# Patient Record
Sex: Female | Born: 1965 | Race: White | Hispanic: No | Marital: Married | Smoking: Never smoker
Health system: Southern US, Community
[De-identification: ages and names within clinical notes are randomized; demographics above are authoritative.]

## PROBLEM LIST (undated history)

## (undated) DIAGNOSIS — J45909 Unspecified asthma, uncomplicated: Secondary | ICD-10-CM

## (undated) DIAGNOSIS — E119 Type 2 diabetes mellitus without complications: Secondary | ICD-10-CM

## (undated) HISTORY — PX: ABDOMINAL HYSTERECTOMY: SHX81

---

## 2005-08-23 ENCOUNTER — Emergency Department: Payer: Self-pay | Admitting: Emergency Medicine

## 2005-08-24 ENCOUNTER — Emergency Department: Payer: Self-pay | Admitting: Emergency Medicine

## 2009-06-11 ENCOUNTER — Emergency Department: Payer: Self-pay | Admitting: Emergency Medicine

## 2012-09-27 ENCOUNTER — Emergency Department: Payer: Self-pay | Admitting: Emergency Medicine

## 2012-12-28 ENCOUNTER — Ambulatory Visit: Payer: Self-pay | Admitting: Family Medicine

## 2013-07-14 ENCOUNTER — Emergency Department (HOSPITAL_COMMUNITY)
Admission: EM | Admit: 2013-07-14 | Discharge: 2013-07-14 | Disposition: A | Discharge status: Home / Self Care | Payer: BC Managed Care – PPO | Attending: Emergency Medicine | Admitting: Emergency Medicine

## 2013-07-14 ENCOUNTER — Encounter (HOSPITAL_COMMUNITY): Payer: Self-pay | Admitting: *Deleted

## 2013-07-14 DIAGNOSIS — T628X1A Toxic effect of other specified noxious substances eaten as food, accidental (unintentional), initial encounter: Secondary | ICD-10-CM | POA: Insufficient documentation

## 2013-07-14 DIAGNOSIS — T7840XA Allergy, unspecified, initial encounter: Secondary | ICD-10-CM

## 2013-07-14 DIAGNOSIS — R110 Nausea: Secondary | ICD-10-CM | POA: Insufficient documentation

## 2013-07-14 DIAGNOSIS — Z791 Long term (current) use of non-steroidal anti-inflammatories (NSAID): Secondary | ICD-10-CM | POA: Insufficient documentation

## 2013-07-14 DIAGNOSIS — J45901 Unspecified asthma with (acute) exacerbation: Secondary | ICD-10-CM | POA: Insufficient documentation

## 2013-07-14 DIAGNOSIS — Y9389 Activity, other specified: Secondary | ICD-10-CM | POA: Insufficient documentation

## 2013-07-14 DIAGNOSIS — R42 Dizziness and giddiness: Secondary | ICD-10-CM | POA: Insufficient documentation

## 2013-07-14 DIAGNOSIS — Z880 Allergy status to penicillin: Secondary | ICD-10-CM | POA: Insufficient documentation

## 2013-07-14 DIAGNOSIS — R0682 Tachypnea, not elsewhere classified: Secondary | ICD-10-CM | POA: Insufficient documentation

## 2013-07-14 DIAGNOSIS — Y9289 Other specified places as the place of occurrence of the external cause: Secondary | ICD-10-CM | POA: Insufficient documentation

## 2013-07-14 DIAGNOSIS — R1310 Dysphagia, unspecified: Secondary | ICD-10-CM | POA: Insufficient documentation

## 2013-07-14 DIAGNOSIS — Z79899 Other long term (current) drug therapy: Secondary | ICD-10-CM | POA: Insufficient documentation

## 2013-07-14 HISTORY — DX: Unspecified asthma, uncomplicated: J45.909

## 2013-07-14 MED ORDER — EPINEPHRINE 0.3 MG/0.3ML IJ SOAJ: 0.3000 mg | INTRAMUSCULAR | Status: AC | PRN

## 2013-07-14 MED ORDER — RANITIDINE HCL 150 MG/10ML PO SYRP
150.0000 mg | ORAL_SOLUTION | Freq: Once | ORAL | Status: AC
  Administered 2013-07-14: 150 mg via ORAL
  Filled 2013-07-14: qty 10

## 2013-07-14 MED ORDER — PREDNISONE 10 MG PO TABS: 40.0000 mg | ORAL_TABLET | Freq: Every day | ORAL | Status: AC

## 2013-07-14 MED ORDER — DIPHENHYDRAMINE HCL 25 MG PO CAPS
25.0000 mg | ORAL_CAPSULE | Freq: Once | ORAL | Status: AC
  Administered 2013-07-14: 25 mg via ORAL
  Filled 2013-07-14: qty 1

## 2013-07-14 MED ORDER — PREDNISONE 20 MG PO TABS
60.0000 mg | ORAL_TABLET | Freq: Once | ORAL | Status: AC
  Administered 2013-07-14: 60 mg via ORAL
  Filled 2013-07-14: qty 3

## 2013-07-14 MED ORDER — EPINEPHRINE 0.3 MG/0.3ML IJ SOAJ
0.3000 mg | Freq: Once | INTRAMUSCULAR | Status: AC
  Administered 2013-07-14: 0.3 mg via INTRAMUSCULAR
  Filled 2013-07-14: qty 0.3

## 2013-07-14 NOTE — ED Provider Notes (Signed)
Patient developed shortness of breath after she ate eggs approximately 20 2:45 PM today. Patient is mildly anxious appearing, speaks in paragraphs, lungs clear auscultation heart regular rate and rhythm abdomen nondistended nontender skin warm dry no rash.  Doug Sou, MD 07/15/13 1478

## 2013-07-14 NOTE — ED Notes (Signed)
The pts sats are good no visible swelling to the back of her throat.  She is very apprehensive and nervous.  Reactions x 4 in the past for egg exposure

## 2013-07-14 NOTE — ED Provider Notes (Signed)
CSN: [id number]     Arrival date & time 07/14/13  1517 History     None    Chief Complaint  Patient presents with  . Allergic Reaction   (Consider location/radiation/quality/duration/timing/severity/associated sxs/prior Treatment) HPI 47 year old female with history of asthma and antiallergy presents with shortness of breath, lightheadedness, nausea after eating eggs at approximately 2:45 PM. She reports she ate food that she did not have contained eggs, and shortly after this began to feel short of breath, lightheaded, nauseous. Reports she feels as if her throat is closing. Denies any vomiting, diarrhea, or oral swelling, rash, pruritus. Reports she has had 4 other episodes in the past when she's been exposed eggs. Has never received epinephrine, and has never owned an EpiPen. Symptoms are moderate and patient feels that they are improving slightly.   Past Medical History  Diagnosis Date  . Asthma    History reviewed. No pertinent past surgical history. No family history on file. History  Substance Use Topics  . Smoking status: Never Smoker   . Smokeless tobacco: Not on file  . Alcohol Use: No   OB History   Grav Para Term Preterm Abortions TAB SAB Ect Mult Living                 Review of Systems  Constitutional: Negative for fever.  HENT: Positive for trouble swallowing. Negative for sore throat and neck pain.   Eyes: Negative for visual disturbance.  Respiratory: Positive for shortness of breath. Negative for cough.   Cardiovascular: Negative for chest pain.  Gastrointestinal: Negative for nausea, vomiting and abdominal pain.  Genitourinary: Negative for difficulty urinating.  Musculoskeletal: Negative for back pain.  Skin: Negative for rash.  Allergic/Immunologic: Positive for food allergies.  Neurological: Positive for light-headedness. Negative for syncope and headaches.    Allergies  Baby powder; Eggs or egg-derived products; Penicillins; and Sulfa  antibiotics  Home Medications   Current Outpatient Rx  Name  Route  Sig  Dispense  Refill  . diphenhydrAMINE (BENADRYL) 25 MG tablet   Oral   Take 25 mg by mouth every 6 (six) hours as needed (for allergic reaction).          . fish oil-omega-3 fatty acids 1000 MG capsule   Oral   Take 1 g by mouth 2 (two) times daily.         Marland Kitchen lubiprostone (AMITIZA) 8 MCG capsule   Oral   Take 8 mcg by mouth 2 (two) times daily with a meal.         . meloxicam (MOBIC) 7.5 MG tablet   Oral   Take 7.5 mg by mouth 2 (two) times daily.         . traMADol (ULTRAM) 50 MG tablet   Oral   Take 50 mg by mouth every 6 (six) hours as needed for pain.         Marland Kitchen VITAMIN E PO   Oral   Take 1 tablet by mouth daily.         Marland Kitchen EPINEPHrine (EPIPEN) 0.3 mg/0.3 mL SOAJ injection   Intramuscular   Inject 0.3 mLs (0.3 mg total) into the muscle as needed.   2 Device   0   . predniSONE (DELTASONE) 10 MG tablet   Oral   Take 4 tablets (40 mg total) by mouth daily.   8 tablet   0    BP 120/54  Pulse 82  Temp(Src) 97.7 F (36.5 C)  Resp 16  SpO2  99%  LMP 06/16/2013 Physical Exam  Nursing note and vitals reviewed. Constitutional: She is oriented to person, place, and time. She appears well-developed and well-nourished. No distress.  HENT:  Head: Normocephalic and atraumatic.  Mouth/Throat: No oropharyngeal exudate.  Eyes: Conjunctivae and EOM are normal.  Neck: Normal range of motion.  Cardiovascular: Normal rate, regular rhythm, normal heart sounds and intact distal pulses.  Exam reveals no gallop and no friction rub.   No murmur heard. Pulmonary/Chest: Breath sounds normal. Tachypnea (mild, improved during evaluation) noted. No respiratory distress. She has no wheezes. She has no rales.  Abdominal: Soft. She exhibits no distension. There is no tenderness. There is no guarding.  Musculoskeletal: She exhibits no edema and no tenderness.  Neurological: She is alert and oriented to  person, place, and time.  Skin: Skin is warm and dry. No rash noted. She is not diaphoretic. No erythema.    ED Course   Procedures (including critical care time)  Labs Reviewed - No data to display No results found. 1. Allergic reaction, initial encounter     MDM  48-year-old female with history of asthma and antiallergy presents with shortness of breath, lightheadedness, nausea after eating eggs at approximately 2:45 PM. Patient rested emergency department with stable vital signs, however with symptoms suggestive of possible anaphylaxis. Patient reported shortness of breath, lightheadedness and nausea as well sensation that her throat is closing and was tachypneic on original evaluation. Patient given IM epinephrine, with improvement of symptoms and no other complication. Given 60 mg of prednisone, 25 mg of Benadryl to total 15 mg, Zantac, and was observed for 6 hours in the emergency department. Patient exhibited no rebound of symptoms, including no rash, nausea, throat swelling, tongue swelling, greatest, lightheadedness, SOB.  Patient was discharged with a prescription for 2 epi pens, and given instructions of when to use this. Given prednisone for the next 2 days and told to schedule Benadryl. Discharged in stable condition understanding resistant to return. Care discussed with attending Dr. Ethelda Chick.  Rhae Lerner, MD 07/15/13 1610  Rhae Lerner, MD 07/15/13 [id number]

## 2013-07-14 NOTE — ED Notes (Signed)
The pt reports that she is allergic to eggs and  She ate  Food that contained eggs approx 1430. She feels like her throat is closing up with dizziness.  She took a 25mg  benadryl just after she realized that the meal had egg in it. No distress

## 2013-07-14 NOTE — ED Notes (Signed)
Pharmacy tech at bedside 

## 2013-07-14 NOTE — ED Notes (Addendum)
Pt reports she is allergic to eggs, ate lunch around 245pm today, didn't know there were eggs in her meal, once she found out she immediately took a benadryl. sts she feels dizzy, nauseous and sob. sts these are the same symptoms she has gotten in the past. Denies use or having an epipen. Denies itchiness/rash. Pt speaking in short sentences. Pt in nad, skin warm and dry, resp e/u.

## 2013-07-14 NOTE — ED Notes (Signed)
Pt has ride home.

## 2013-07-15 NOTE — ED Provider Notes (Signed)
I have personally seen and examined the patient.  I have discussed the plan of care with the resident.  I have reviewed the documentation on PMH/FH/Soc. History.  I have reviewed the documentation of the resident and agree.  Doug Sou, MD 07/15/13 1452

## 2013-11-06 ENCOUNTER — Emergency Department: Payer: Self-pay | Admitting: Emergency Medicine

## 2014-01-03 ENCOUNTER — Emergency Department: Payer: Self-pay | Admitting: Internal Medicine

## 2014-12-26 ENCOUNTER — Ambulatory Visit: Payer: Self-pay | Admitting: Unknown Physician Specialty

## 2015-03-19 ENCOUNTER — Ambulatory Visit
Admit: 2015-03-19 | Disposition: A | Discharge status: Home / Self Care | Payer: Self-pay | Attending: Obstetrics and Gynecology | Admitting: Obstetrics and Gynecology

## 2015-03-19 LAB — COMPREHENSIVE METABOLIC PANEL
ALK PHOS: 62 U/L
ALT: 30 U/L
AST: 23 U/L
Albumin: 4 g/dL
Anion Gap: 4 — ABNORMAL LOW (ref 7–16)
BUN: 9 mg/dL
Bilirubin,Total: 0.9 mg/dL
CHLORIDE: 109 mmol/L
CO2: 27 mmol/L
Calcium, Total: 9 mg/dL
Creatinine: 0.67 mg/dL
EGFR (Non-African Amer.): >60
Glucose: 102 mg/dL — ABNORMAL HIGH
POTASSIUM: 4.1 mmol/L
Sodium: 140 mmol/L
TOTAL PROTEIN: 6.8 g/dL

## 2015-03-19 LAB — CBC WITH DIFFERENTIAL/PLATELET
BASOS PCT: 0.5 %
Basophil #: 0 10*3/uL (ref 0.0–0.1)
EOS PCT: 1.5 %
Eosinophil #: 0.1 10*3/uL (ref 0.0–0.7)
HCT: 38 % (ref 35.0–47.0)
HGB: 12.4 g/dL (ref 12.0–16.0)
LYMPHS ABS: 2.1 10*3/uL (ref 1.0–3.6)
Lymphocyte %: 23.2 %
MCH: 28.7 pg (ref 26.0–34.0)
MCHC: 32.7 g/dL (ref 32.0–36.0)
MCV: 88 fL (ref 80–100)
MONOS PCT: 8.6 %
Monocyte #: 0.8 x10 3/mm (ref 0.2–0.9)
NEUTROS PCT: 66.2 %
Neutrophil #: 6 10*3/uL (ref 1.4–6.5)
Platelet: 258 10*3/uL (ref 150–440)
RBC: 4.32 10*6/uL (ref 3.80–5.20)
RDW: 13.2 % (ref 11.5–14.5)
WBC: 9 10*3/uL (ref 3.6–11.0)

## 2015-03-25 ENCOUNTER — Inpatient Hospital Stay
Admit: 2015-03-25 | Disposition: A | Discharge status: Home / Self Care | Payer: Self-pay | Attending: Obstetrics and Gynecology | Admitting: Obstetrics and Gynecology

## 2015-03-25 LAB — CBC WITH DIFFERENTIAL/PLATELET
BASOS ABS: 0 10*3/uL (ref 0.0–0.1)
Basophil %: 0.1 %
Eosinophil #: 0 10*3/uL (ref 0.0–0.7)
Eosinophil %: 0.1 %
HCT: 37.2 % (ref 35.0–47.0)
HGB: 12.4 g/dL (ref 12.0–16.0)
LYMPHS ABS: 1.2 10*3/uL (ref 1.0–3.6)
LYMPHS PCT: 5.3 %
MCH: 28.9 pg (ref 26.0–34.0)
MCHC: 33.3 g/dL (ref 32.0–36.0)
MCV: 87 fL (ref 80–100)
MONO ABS: 0.8 x10 3/mm (ref 0.2–0.9)
MONOS PCT: 3.3 %
NEUTROS PCT: 91.2 %
Neutrophil #: 21 10*3/uL — ABNORMAL HIGH (ref 1.4–6.5)
PLATELETS: 246 10*3/uL (ref 150–440)
RBC: 4.3 10*6/uL (ref 3.80–5.20)
RDW: 13.7 % (ref 11.5–14.5)
WBC: 23 10*3/uL — AB (ref 3.6–11.0)

## 2015-03-26 LAB — BASIC METABOLIC PANEL
ANION GAP: 2 — AB (ref 7–16)
BUN: 10 mg/dL
CALCIUM: 7.7 mg/dL — AB
CO2: 25 mmol/L
Chloride: 109 mmol/L
Creatinine: 0.66 mg/dL
Glucose: 115 mg/dL — ABNORMAL HIGH
Potassium: 4.3 mmol/L
Sodium: 136 mmol/L

## 2015-03-26 LAB — HEMATOCRIT: HCT: 34.3 % — AB (ref 35.0–47.0)

## 2015-04-01 LAB — SURGICAL PATHOLOGY

## 2015-04-07 NOTE — Op Note (Signed)
PATIENT NAME:  Kathryn Pearson, Kathryn Pearson MR#:  [id number] DATE OF BIRTH:  [birth year]  DATE OF PROCEDURE:  03/25/2015  PREOPERATIVE DIAGNOSES:  1. Chronic pelvic pain.  2. Menorrhagia.  3. History of endometriosis.   POSTOPERATIVE DIAGNOSES: 1. Chronic pelvic pain.  2. Pelvic adhesions.   PROCEDURES: 1. Total abdominal hysterectomy.  2. Bilateral salpingo-oophorectomy.  3. Cystoscopy.   SURGEON: Suzy Bouchard, MD   FIRST ASSISTANT: Christeen Douglas, MD  ANESTHESIA: General endotracheal.   INDICATIONS: This is a 49 year old gravida 3, para 2 with a greater than 1 year history of right pelvic pain. The patient had a previous laparoscopic surgery in 1998 that demonstrated endometriosis. The patient also with heavy bleeding.   FINDINGS: The patient with a very narrow pelvis, a very deep cervix, was noted to have several dense adhesions of  the omentum to the left fundal area of the uterus and omental adhesions also noted on the left adnexa to the sidewall.   PROCEDURE IN DETAIL: After adequate general endotracheal anesthesia, the patient was placed in the dorsal supine position, was previously prophylaxed with 160 mg of gentamicin and 900 mg clindamycin given her penicillin allergy. The patient was prepped and draped in normal sterile fashion. Foley catheter was placed into the bladder after vaginal prep was performed. A Pfannenstiel incision was made 2 fingerbreadths above the symphysis pubis. Sharp dissection was used to identify the fascia. The fascia was opened in the midline and opened in a transverse fashion. The superior aspect of the fascia was grasped with Kocher clamps and the recti muscles dissected free. The inferior aspect of the fascia was grasped with Kocher clamps and the pyramidalis muscle was dissected free. Entry into the peritoneal cavity was accomplished sharply. The peritoneum was incised vertically. Initial impression inside the abdominal cavity, had several dense adhesions  to the uterus, left fundal and to the left sidewall.  These adhesions were clamped, transected,  suture ligated, and taken down additionally with the Bovie to free the adnexa on the left.  Two large Kelly clamps were placed on the cornua after the O'Connor-O'Sullivan retractor was placed into the incision and the bowel was packed cephalad with laparotomy sponges. Round ligaments on both sides were bilaterally clamped, transected, and suture ligated with 0 Vicryl suture. The anterior leaf of the broad ligament was incised along the bladder reflection to the midline from both sides.  The bladder was densely adherent to the anterior cervix. This was taken down sharply with Metzenbaum scissors. Ultimately, the bladder was dissected off the lower uterine segment and cervix to the full cervical extent. A window was made on the broad ligament bilaterally and the infundibular ligaments on both sides were doubly clamped, transected, and suture ligated with 0 Vicryl suture. Good hemostasis was noted. Uterine arteries were skeletonized bilaterally and Heaney clamped, transected, and suture ligated with 0 Vicryl suture. There was some brisk bleeding noted on both sides as the uterine arteries were transected. Several clamps were placed with ultimate control of bleeding. Again, the cervix was extremely deep and long.  Several straight Heaney clamps were used to clamp the cardinal ligaments and transecting were suture ligated with 0 Vicryl. Ultimately, the angles were clamped and the uterus and cervix was removed with the attached fallopian tubes and ovaries. The vaginal angles were closed with interrupted 0 Vicryl sutures and the vaginal cuff was then reapproximated with interrupted  0 Vicryl suture. Good hemostasis was noted at this point.  The pelvis was copiously irrigated with sterile water.  There was no active bleeding noted.  The laparotomy sponges and instruments were all removed from the abdomen. Given the amount of  dissection and the nature of the controlling of the bleeding bilaterally, surgeon felt it was prudent to do a cystoscopy which was performed at the end of the case. Foley catheter was removed and a 30 degree cystoscope was placed.  Ureter orifices were ultimately identified, very flat and flush  to the trigone of the bladder.  One mL of fluorescein was administered intravenously and normal peristalsis with the dye effluxing from each ureteral orifice was verified. The bladder was drained, Foley was replaced. Gloves were changed and attention was then directed to the patient's fascia which was closed with a 0 Vicryl suture in a running nonlocking fashion. Subcutaneous tissues were irrigated and bovied for hemostasis and given the depth of the subcutaneous tissues, the dead space was closed with a running 2-0 chromic suture. The skin was reapproximated with staples then. Given the amount of blood loss estimated at 1400 mL, the patient did receive  1 unit of packed red blood cells and she remained clinically stable throughout the procedure.   ESTIMATED BLOOD LOSS:  1400 mL.    INTRAOPERATIVE FLUIDS:  2000 mL.    URINE OUTPUT:  450 mL.    The patient was taken to the recovery room in good condition. There were no complications other than excessive blood loss.    ____________________________ Suzy Bouchardhomas J. Schermerhorn, MD tjs:tr D: 03/25/2015 17:15:34 ET T: 03/25/2015 21:01:22 ET JOB#: [id number]  cc: Suzy Bouchardhomas J. Schermerhorn, MD, <Dictator> Suzy BouchardHOMAS J SCHERMERHORN MD ELECTRONICALLY SIGNED 03/29/2015 21:12

## 2015-05-31 ENCOUNTER — Other Ambulatory Visit: Payer: Self-pay

## 2015-05-31 ENCOUNTER — Emergency Department
Admission: EM | Admit: 2015-05-31 | Discharge: 2015-06-01 | Disposition: A | Discharge status: Home / Self Care | Attending: Emergency Medicine | Admitting: Emergency Medicine

## 2015-05-31 DIAGNOSIS — R109 Unspecified abdominal pain: Secondary | ICD-10-CM

## 2015-05-31 DIAGNOSIS — Z880 Allergy status to penicillin: Secondary | ICD-10-CM | POA: Insufficient documentation

## 2015-05-31 DIAGNOSIS — Z79899 Other long term (current) drug therapy: Secondary | ICD-10-CM | POA: Diagnosis not present

## 2015-05-31 DIAGNOSIS — I951 Orthostatic hypotension: Secondary | ICD-10-CM | POA: Diagnosis not present

## 2015-05-31 DIAGNOSIS — R1110 Vomiting, unspecified: Secondary | ICD-10-CM

## 2015-05-31 DIAGNOSIS — K529 Noninfective gastroenteritis and colitis, unspecified: Secondary | ICD-10-CM

## 2015-05-31 LAB — CBC WITH DIFFERENTIAL/PLATELET
BASOS ABS: 0.1 10*3/uL (ref 0–0.1)
Basophils Relative: 1 %
Eosinophils Absolute: 0.2 10*3/uL (ref 0–0.7)
Eosinophils Relative: 1 %
HCT: 37.5 % (ref 35.0–47.0)
HEMOGLOBIN: 12.4 g/dL (ref 12.0–16.0)
LYMPHS ABS: 1.3 10*3/uL (ref 1.0–3.6)
Lymphocytes Relative: 9 %
MCH: 27.4 pg (ref 26.0–34.0)
MCHC: 33 g/dL (ref 32.0–36.0)
MCV: 82.9 fL (ref 80.0–100.0)
MONOS PCT: 7 %
Monocytes Absolute: 1 10*3/uL — ABNORMAL HIGH (ref 0.2–0.9)
NEUTROS ABS: 12.3 10*3/uL — AB (ref 1.4–6.5)
NEUTROS PCT: 82 %
PLATELETS: 309 10*3/uL (ref 150–440)
RBC: 4.52 MIL/uL (ref 3.80–5.20)
RDW: 14.2 % (ref 11.5–14.5)
WBC: 14.8 10*3/uL — ABNORMAL HIGH (ref 3.6–11.0)

## 2015-05-31 LAB — BASIC METABOLIC PANEL
ANION GAP: 8 (ref 5–15)
BUN: 16 mg/dL (ref 6–20)
CALCIUM: 9.3 mg/dL (ref 8.9–10.3)
CO2: 24 mmol/L (ref 22–32)
Chloride: 107 mmol/L (ref 101–111)
Creatinine, Ser: 0.92 mg/dL (ref 0.44–1.00)
Glucose, Bld: 132 mg/dL — ABNORMAL HIGH (ref 65–99)
Potassium: 3.8 mmol/L (ref 3.5–5.1)
SODIUM: 139 mmol/L (ref 135–145)

## 2015-05-31 LAB — LIPASE, BLOOD: LIPASE: 28 U/L (ref 22–51)

## 2015-05-31 NOTE — ED Provider Notes (Signed)
[facility] Emergency Department Provider Note  ____________________________________________  Time seen: Approximately 11:29 PM  I have reviewed the triage vital signs and the nursing notes.   HISTORY  Chief Complaint Loss of Consciousness; Emesis; and Weakness    HPI Kathryn Pearson is a 49 y.o. female with a history of a hysterectomy about 2 months agoand well-controlled asthma who presents with 7 date episodes of emesis, 2 episodes of diarrhea, and 3-4 episodes of "passing out ".  The symptoms began gradually around noon today shortly after she ate lunch and developed into severe nausea followed by multiple episodes of emesis.  She then started having 2 episodes of loose stools as well accompanied by moderate cramping lower abdominal pain.  After this had gone on for several hours, she felt lightheaded when she would stand up and has "passed out "after standing up and going to the bathroom as recently as while she was in the emergency department.  The symptoms are described as severe.  Nothing makes them better and nothing makes them worse.   Past Medical History  Diagnosis Date  . Asthma     There are no active problems to display for this patient.   Past Surgical History  Procedure Laterality Date  . Abdominal hysterectomy      Current Outpatient Rx  Name  Route  Sig  Dispense  Refill  . b complex vitamins tablet   Oral   Take 1 tablet by mouth daily.         . Cholecalciferol (VITAMIN D3) 5000 UNITS TABS   Oral   Take 1 tablet by mouth daily.         Marland Kitchen EPINEPHrine (EPIPEN) 0.3 mg/0.3 mL SOAJ injection   Intramuscular   Inject 0.3 mLs (0.3 mg total) into the muscle as needed.   2 Device   0   . estrogens, conjugated, (PREMARIN) 0.9 MG tablet   Oral   Take 0.9 mg by mouth daily. Take daily for 21 days then do not take for 7 days.         . fish oil-omega-3 fatty acids 1000 MG capsule   Oral   Take 1 g by mouth daily.           . Linaclotide (LINZESS) 145 MCG CAPS capsule   Oral   Take 145 mcg by mouth daily.         . pantoprazole (PROTONIX) 40 MG tablet   Oral   Take 40 mg by mouth daily.         Marland Kitchen VITAMIN E PO   Oral   Take 1 tablet by mouth daily.         . diphenhydrAMINE (BENADRYL) 25 MG tablet   Oral   Take 25 mg by mouth every 6 (six) hours as needed (for allergic reaction).          Marland Kitchen HYDROcodone-acetaminophen (NORCO/VICODIN) 5-325 MG per tablet   Oral   Take 1-2 tablets by mouth every 4 (four) hours as needed for moderate pain.   15 tablet   0   . lubiprostone (AMITIZA) 8 MCG capsule   Oral   Take 8 mcg by mouth 2 (two) times daily with a meal.         . meloxicam (MOBIC) 7.5 MG tablet   Oral   Take 7.5 mg by mouth 2 (two) times daily.         . ondansetron (ZOFRAN) 4 MG tablet  Take 1-2 tabs by mouth every 8 hours as needed for nausea/vomiting   30 tablet   0   . traMADol (ULTRAM) 50 MG tablet   Oral   Take 50 mg by mouth every 6 (six) hours as needed for pain.           Allergies Baby powder; Eggs or egg-derived products; Penicillins; and Sulfa antibiotics  No family history on file.  Social History History  Substance Use Topics  . Smoking status: Never Smoker   . Smokeless tobacco: Not on file  . Alcohol Use: No    Review of Systems Constitutional: No fever/chills Eyes: No visual changes. ENT: No sore throat. Cardiovascular: Denies chest pain. Respiratory: Denies shortness of breath. Gastrointestinal: Cramping lower abdominal pain with 7-8 episodes of emesis and 2 episodes of diarrhea.  . She reports a small amount of bright red blood in the penultimate episode of emesis Genitourinary: Negative for dysuria. Musculoskeletal: Negative for back pain. Skin: Negative for rash. Neurological: Negative for headaches, focal weakness or numbness.  Reports 3-4 episodes of near-syncope or syncope  10-point ROS otherwise  negative.  ____________________________________________   PHYSICAL EXAM:  VITAL SIGNS: ED Triage Vitals  Enc Vitals Group     BP 05/31/15 2150 156/80 mmHg     Pulse Rate 05/31/15 2150 68     Resp 05/31/15 2150 20     Temp 05/31/15 2150 97.5 F (36.4 C)     Temp Source 05/31/15 2150 Oral     SpO2 05/31/15 2150 100 %     Weight 05/31/15 2150 225 lb (102.059 kg)     Height 05/31/15 2150  (1.575 m)     Head Cir --      Peak Flow --      Pain Score 05/31/15 2151 8     Pain Loc --      Pain Edu? --      Excl. in GC? --     Constitutional: Alert and oriented. Well appearing and in no acute distress but appears uncomfortable Eyes: Conjunctivae are normal. PERRL. EOMI. Head: Atraumatic. Nose: No congestion/rhinnorhea. Mouth/Throat: Mucous membranes are moist.  Oropharynx non-erythematous. Neck: No stridor.   Cardiovascular: Normal rate, regular rhythm. Grossly normal heart sounds.  Good peripheral circulation. Respiratory: Normal respiratory effort.  No retractions. Lungs CTAB. Gastrointestinal: Soft, obese, and nontender. No distention. No abdominal bruits. No CVA tenderness. Musculoskeletal: No lower extremity tenderness nor edema.  No joint effusions. Neurologic:  Normal speech and language. No gross focal neurologic deficits are appreciated. Speech is normal. Skin:  Skin is warm, dry and intact. No rash noted. Psychiatric: Mood and affect are normal. Speech and behavior are normal.  ____________________________________________   LABS (all labs ordered are listed, but only abnormal results are displayed)  Labs Reviewed  CBC WITH DIFFERENTIAL/PLATELET - Abnormal; Notable for the following:    WBC 14.8 (*)    Neutro Abs 12.3 (*)    Monocytes Absolute 1.0 (*)    All other components within normal limits  BASIC METABOLIC PANEL - Abnormal; Notable for the following:    Glucose, Bld 132 (*)    All other components within normal limits  URINALYSIS COMPLETEWITH  MICROSCOPIC ([facility] ONLY) - Abnormal; Notable for the following:    Color, Urine YELLOW (*)    APPearance HAZY (*)    Ketones, ur TRACE (*)    Bacteria, UA RARE (*)    Squamous Epithelial / LPF 0-5 (*)    All other components within  normal limits  HEPATIC FUNCTION PANEL - Abnormal; Notable for the following:    Total Bilirubin 1.3 (*)    Indirect Bilirubin 1.2 (*)    All other components within normal limits  LIPASE, BLOOD   ____________________________________________  EKG  ED ECG REPORT I, Charo Philipp, the attending physician, personally viewed and interpreted this ECG.  Date: 06/01/2015 EKG Time: 22:13 Rate: 70 Rhythm: normal sinus rhythm QRS Axis: normal Intervals: Incomplete right bundle branch block ST/T Wave abnormalities: Non-specific ST segment / T-wave changes, but no evidence of acute ischemia. Conduction Disutrbances: none Narrative Interpretation: unremarkable  ____________________________________________  RADIOLOGY  Not indicated  ____________________________________________   PROCEDURES  Procedure(s) performed: None  Critical Care performed: No ____________________________________________   INITIAL IMPRESSION / ASSESSMENT AND PLAN / ED COURSE  Pertinent labs & imaging results that were available during my care of the patient were reviewed by me and considered in my medical decision making (see chart for details).  Signs and symptoms are strongly suggestive of either a food poisoning or viral gastroenteritis.  The patient is hemodynamically stable but her complaints suggest orthostatic hypotension.  I will treat empirically with 2 L of normal saline as well as with Zofran 4 mg.  Her physical exam and labs are generally reassuring.  I will reassess after treatment with fluids and antiemetics.  The patient and her family agree with this plan.  ----------------------------------------- 3:19 AM on  06/01/2015 -----------------------------------------  Reassess the patient.  Her orthostatic vital signs were normal after 2 L of fluid, but the patient complains of persistent epigastric discomfort that is mostly nausea.  I am in a treat her with additional Zofran as well as some morphine to see if this helps her feel better.  ----------------------------------------- 5:04 AM on 06/01/2015 -----------------------------------------  The patient feels much better with complete resolution of her pain and nausea.  She tolerates by mouth in the ED (ginger ale).  I gave her and her husband my usual and customary return precautions.  She wants to go home and is comfortable with the plan. ____________________________________________  FINAL CLINICAL IMPRESSION(S) / ED DIAGNOSES  Final diagnoses:  Gastroenteritis  Orthostatic hypotension  Vomiting  Abdominal pain      NEW MEDICATIONS STARTED DURING THIS VISIT:  New Prescriptions   HYDROCODONE-ACETAMINOPHEN (NORCO/VICODIN) 5-325 MG PER TABLET    Take 1-2 tablets by mouth every 4 (four) hours as needed for moderate pain.   ONDANSETRON (ZOFRAN) 4 MG TABLET    Take 1-2 tabs by mouth every 8 hours as needed for nausea/vomiting     Loleta Rose, MD 06/01/15 0505

## 2015-05-31 NOTE — ED Notes (Signed)
Pt reports "passing out 4 times".

## 2015-05-31 NOTE — ED Notes (Addendum)
Pt reports having a hysterectomy at this facility on April 18th of this year. Pt reports complications with both the intubation, "I have a small throat", and surgery itself, as pt reports needing 1 or 2 units of blood transfused during the surgery.

## 2015-05-31 NOTE — ED Notes (Signed)
Pt to ED c/o epigastric pain with N/V, multiple syncopal episodes.

## 2015-06-01 ENCOUNTER — Emergency Department

## 2015-06-01 LAB — URINALYSIS COMPLETE WITH MICROSCOPIC (ARMC ONLY)
BILIRUBIN URINE: NEGATIVE
GLUCOSE, UA: NEGATIVE mg/dL
Hgb urine dipstick: NEGATIVE
Leukocytes, UA: NEGATIVE
NITRITE: NEGATIVE
Protein, ur: NEGATIVE mg/dL
SPECIFIC GRAVITY, URINE: 1.021 (ref 1.005–1.030)
pH: 5 (ref 5.0–8.0)

## 2015-06-01 LAB — HEPATIC FUNCTION PANEL
ALBUMIN: 4.2 g/dL (ref 3.5–5.0)
ALT: 39 U/L (ref 14–54)
AST: 35 U/L (ref 15–41)
Alkaline Phosphatase: 74 U/L (ref 38–126)
BILIRUBIN TOTAL: 1.3 mg/dL — AB (ref 0.3–1.2)
Bilirubin, Direct: 0.1 mg/dL (ref 0.1–0.5)
Indirect Bilirubin: 1.2 mg/dL — ABNORMAL HIGH (ref 0.3–0.9)
Total Protein: 7.4 g/dL (ref 6.5–8.1)

## 2015-06-01 MED ORDER — SODIUM CHLORIDE 0.9 % IV BOLUS (SEPSIS)
1000.0000 mL | INTRAVENOUS | Status: AC
  Administered 2015-06-01: 1000 mL via INTRAVENOUS

## 2015-06-01 MED ORDER — ONDANSETRON HCL 4 MG/2ML IJ SOLN
INTRAMUSCULAR | Status: AC
  Administered 2015-06-01: 4 mg via INTRAVENOUS
  Filled 2015-06-01: qty 2

## 2015-06-01 MED ORDER — MORPHINE SULFATE 4 MG/ML IJ SOLN
INTRAMUSCULAR | Status: AC
  Administered 2015-06-01: 4 mg via INTRAVENOUS
  Filled 2015-06-01: qty 1

## 2015-06-01 MED ORDER — ONDANSETRON HCL 4 MG PO TABS: ORAL_TABLET | ORAL | Status: DC

## 2015-06-01 MED ORDER — MORPHINE SULFATE 4 MG/ML IJ SOLN
4.0000 mg | Freq: Once | INTRAMUSCULAR | Status: AC
  Administered 2015-06-01: 4 mg via INTRAVENOUS

## 2015-06-01 MED ORDER — ONDANSETRON HCL 4 MG/2ML IJ SOLN: 4.0000 mg | INTRAMUSCULAR | Status: AC

## 2015-06-01 MED ORDER — HYDROCODONE-ACETAMINOPHEN 5-325 MG PO TABS: 1.0000 | ORAL_TABLET | ORAL | Status: DC | PRN

## 2015-06-01 MED ORDER — ONDANSETRON HCL 4 MG/2ML IJ SOLN
4.0000 mg | INTRAMUSCULAR | Status: AC
  Administered 2015-06-01: 4 mg via INTRAVENOUS

## 2015-06-01 NOTE — ED Notes (Addendum)
Pt reports being able to drink "most" of the Ginger Ale before spilling some. Pt denies any c/o nausea or abdominal pain at this time.

## 2015-06-01 NOTE — ED Notes (Signed)
Pt given one 12oz can of Ginger Ale soda per PO challenge for oral hydration.

## 2015-06-01 NOTE — Discharge Instructions (Signed)
We believe your symptoms are caused by either a viral infection or possible a bad food exposure.  Either way, since your symptoms have improved, we feel it is safe for you to go home and follow up with your regular doctor.  Please read the included information and stick to a bland diet for the next two days.  Drink plenty of clear fluids, and if you were provided with a prescription, please take it according to the label instructions.    If you develop any new or worsening symptoms, including persistent vomiting not controlled with medication, fever greater than 101, severe or worsening abdominal pain, or other symptoms that concern you, please return immediately to the Emergency Department.   Viral Gastroenteritis Viral gastroenteritis is also known as stomach flu. This condition affects the stomach and intestinal tract. It can cause sudden diarrhea and vomiting. The illness typically lasts 3 to 8 days. Most people develop an immune response that eventually gets rid of the virus. While this natural response develops, the virus can make you quite ill. CAUSES  Many different viruses can cause gastroenteritis, such as rotavirus or noroviruses. You can catch one of these viruses by consuming contaminated food or water. You may also catch a virus by sharing utensils or other personal items with an infected person or by touching a contaminated surface. SYMPTOMS  The most common symptoms are diarrhea and vomiting. These problems can cause a severe loss of body fluids (dehydration) and a body salt (electrolyte) imbalance. Other symptoms may include:  Fever.  Headache.  Fatigue.  Abdominal pain. DIAGNOSIS  Your caregiver can usually diagnose viral gastroenteritis based on your symptoms and a physical exam. A stool sample may also be taken to test for the presence of viruses or other infections. TREATMENT  This illness typically goes away on its own. Treatments are aimed at rehydration. The most serious  cases of viral gastroenteritis involve vomiting so severely that you are not able to keep fluids down. In these cases, fluids must be given through an intravenous line (IV). HOME CARE INSTRUCTIONS   Drink enough fluids to keep your urine clear or pale yellow. Drink small amounts of fluids frequently and increase the amounts as tolerated.  Ask your caregiver for specific rehydration instructions.  Avoid:  Foods high in sugar.  Alcohol.  Carbonated drinks.  Tobacco.  Juice.  Caffeine drinks.  Extremely hot or cold fluids.  Fatty, greasy foods.  Too much intake of anything at one time.  Dairy products until 24 to 48 hours after diarrhea stops.  You may consume probiotics. Probiotics are active cultures of beneficial bacteria. They may lessen the amount and number of diarrheal stools in adults. Probiotics can be found in yogurt with active cultures and in supplements.  Wash your hands well to avoid spreading the virus.  Only take over-the-counter or prescription medicines for pain, discomfort, or fever as directed by your caregiver. Do not give aspirin to children. Antidiarrheal medicines are not recommended.  Ask your caregiver if you should continue to take your regular prescribed and over-the-counter medicines.  Keep all follow-up appointments as directed by your caregiver. SEEK IMMEDIATE MEDICAL CARE IF:   You are unable to keep fluids down.  You do not urinate at least once every 6 to 8 hours.  You develop shortness of breath.  You notice blood in your stool or vomit. This may look like coffee grounds.  You have abdominal pain that increases or is concentrated in one small area (localized).  You have persistent vomiting or diarrhea.  You have a fever.  The patient is a child younger than 3 months, and he or she has a fever.  The patient is a child older than 3 months, and he or she has a fever and persistent symptoms.  The patient is a child older than 3  months, and he or she has a fever and symptoms suddenly get worse.  The patient is a baby, and he or she has no tears when crying. MAKE SURE YOU:   Understand these instructions.  Will watch your condition.  Will get help right away if you are not doing well or get worse. Document Released: 11/23/2005 Document Revised: 02/15/2012 Document Reviewed: 09/09/2011 [facility] Patient Information 2015 [place], [place]. This information is not intended to replace advice given to you by your health care provider. Make sure you discuss any questions you have with your health care provider.  Abdominal Pain Many things can cause abdominal pain. Usually, abdominal pain is not caused by a disease and will improve without treatment. It can often be observed and treated at home. Your health care provider will do a physical exam and possibly order blood tests and X-rays to help determine the seriousness of your pain. However, in many cases, more time must pass before a clear cause of the pain can be found. Before that point, your health care provider may not know if you need more testing or further treatment. HOME CARE INSTRUCTIONS  Monitor your abdominal pain for any changes. The following actions may help to alleviate any discomfort you are experiencing:  Only take over-the-counter or prescription medicines as directed by your health care provider.  Do not take laxatives unless directed to do so by your health care provider.  Try a clear liquid diet (broth, tea, or water) as directed by your health care provider. Slowly move to a bland diet as tolerated. SEEK MEDICAL CARE IF:  You have unexplained abdominal pain.  You have abdominal pain associated with nausea or diarrhea.  You have pain when you urinate or have a bowel movement.  You experience abdominal pain that wakes you in the night.  You have abdominal pain that is worsened or improved by eating food.  You have abdominal pain that is worsened  with eating fatty foods.  You have a fever. SEEK IMMEDIATE MEDICAL CARE IF:   Your pain does not go away within 2 hours.  You keep throwing up (vomiting).  Your pain is felt only in portions of the abdomen, such as the right side or the left lower portion of the abdomen.  You pass bloody or black tarry stools. MAKE SURE YOU:  Understand these instructions.   Will watch your condition.   Will get help right away if you are not doing well or get worse.  Document Released: 09/02/2005 Document Revised: 11/28/2013 Document Reviewed: 08/02/2013 [facility] Patient Information 2015 [place], [place]. This information is not intended to replace advice given to you by your health care provider. Make sure you discuss any questions you have with your health care provider.

## 2016-06-04 ENCOUNTER — Other Ambulatory Visit: Payer: Self-pay | Admitting: Family Medicine

## 2016-06-04 DIAGNOSIS — R748 Abnormal levels of other serum enzymes: Secondary | ICD-10-CM

## 2016-06-04 DIAGNOSIS — R1011 Right upper quadrant pain: Secondary | ICD-10-CM

## 2016-06-10 ENCOUNTER — Ambulatory Visit
Admission: RE | Admit: 2016-06-10 | Discharge: 2016-06-10 | Disposition: A | Discharge status: Home / Self Care | Source: Ambulatory Visit | Attending: Family Medicine | Admitting: Family Medicine

## 2016-06-10 DIAGNOSIS — R748 Abnormal levels of other serum enzymes: Secondary | ICD-10-CM

## 2016-06-10 DIAGNOSIS — K449 Diaphragmatic hernia without obstruction or gangrene: Secondary | ICD-10-CM | POA: Diagnosis not present

## 2016-06-10 DIAGNOSIS — R932 Abnormal findings on diagnostic imaging of liver and biliary tract: Secondary | ICD-10-CM | POA: Insufficient documentation

## 2016-06-10 DIAGNOSIS — R918 Other nonspecific abnormal finding of lung field: Secondary | ICD-10-CM | POA: Insufficient documentation

## 2016-06-10 DIAGNOSIS — K760 Fatty (change of) liver, not elsewhere classified: Secondary | ICD-10-CM | POA: Insufficient documentation

## 2016-06-10 DIAGNOSIS — R1011 Right upper quadrant pain: Secondary | ICD-10-CM | POA: Insufficient documentation

## 2016-06-10 DIAGNOSIS — I700 Atherosclerosis of aorta: Secondary | ICD-10-CM | POA: Diagnosis not present

## 2016-06-10 MED ORDER — IOHEXOL 350 MG/ML SOLN
100.0000 mL | Freq: Once | INTRAVENOUS | Status: AC | PRN
  Administered 2016-06-10: 100 mL via INTRAVENOUS

## 2016-11-21 IMAGING — US US ABDOMEN LIMITED
1 series · 14 of 25 positions shown · non-contrast
Comparison: CT abdomen and pelvis December 26, 2014

CLINICAL DATA: Abdominal pain and vomiting beginning yesterday.

EXAM:
US ABDOMEN LIMITED - RIGHT UPPER QUADRANT

[Series 1: us abdomen limited · 0.27mm/px · 14 of 34 slices shown]
[im 1/34]
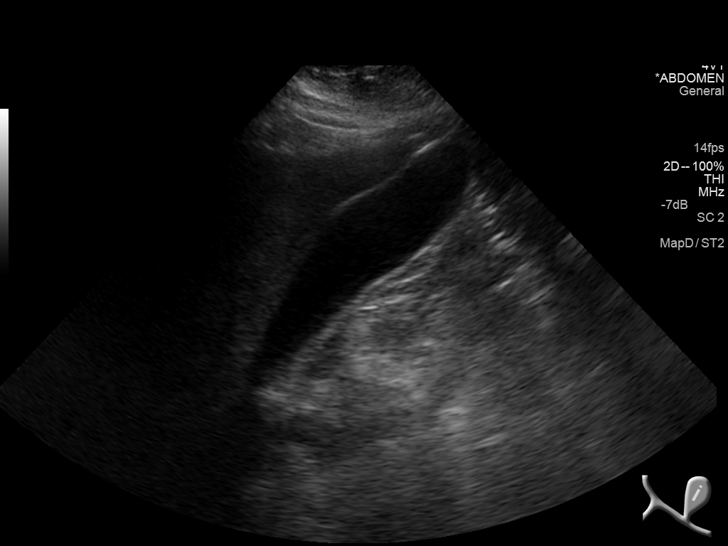
[im 3/34]
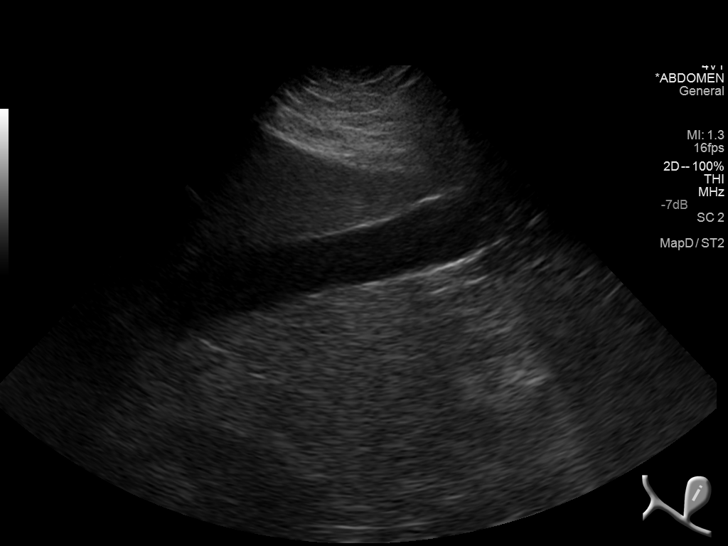
[im 6/34]
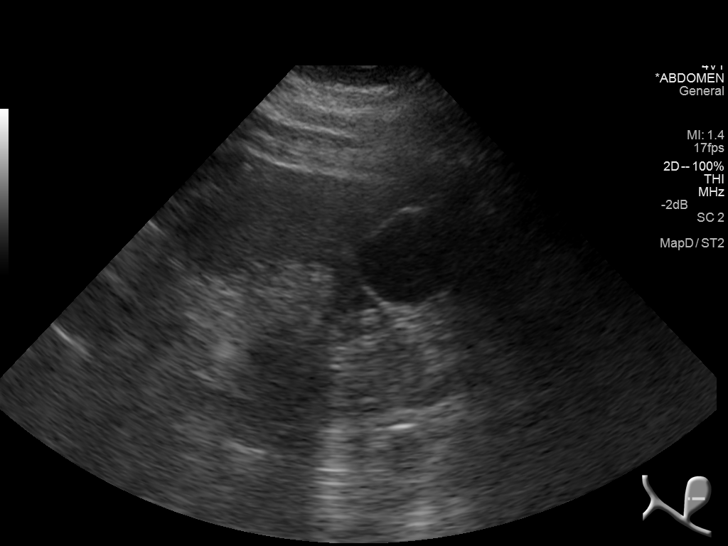
[im 9/34]
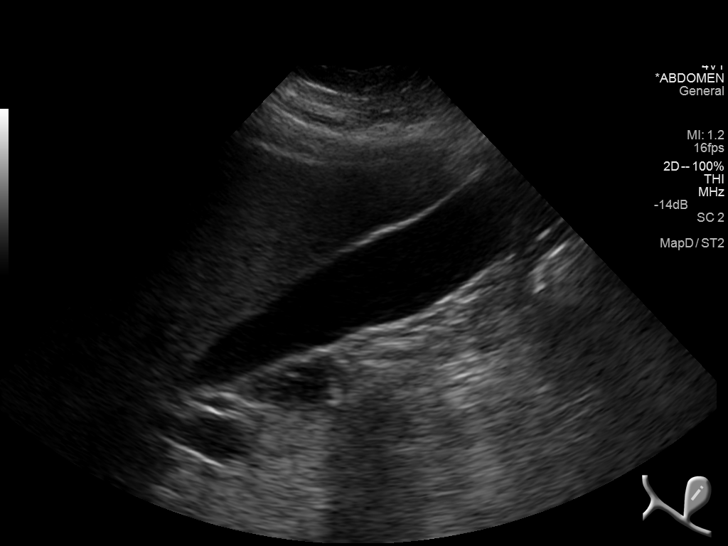
[im 12/34]
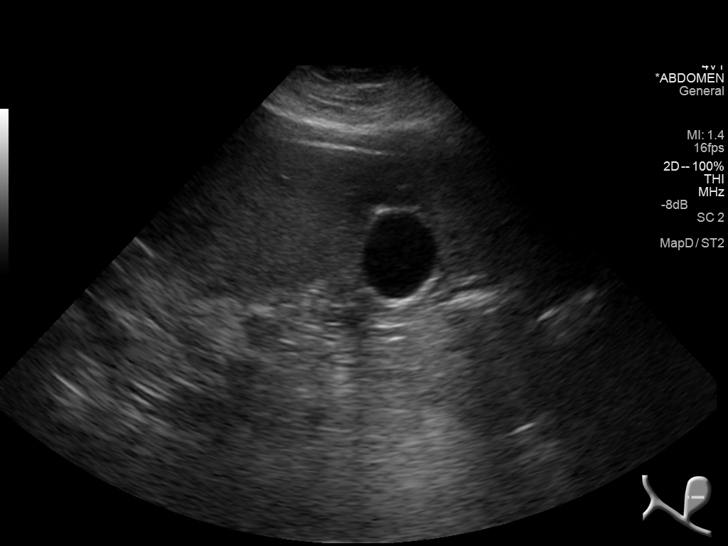
[im 13/34]
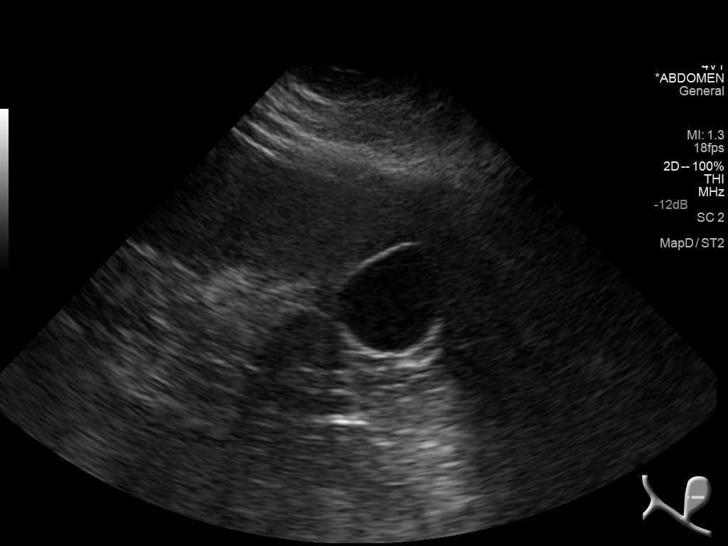
[im 16/34]
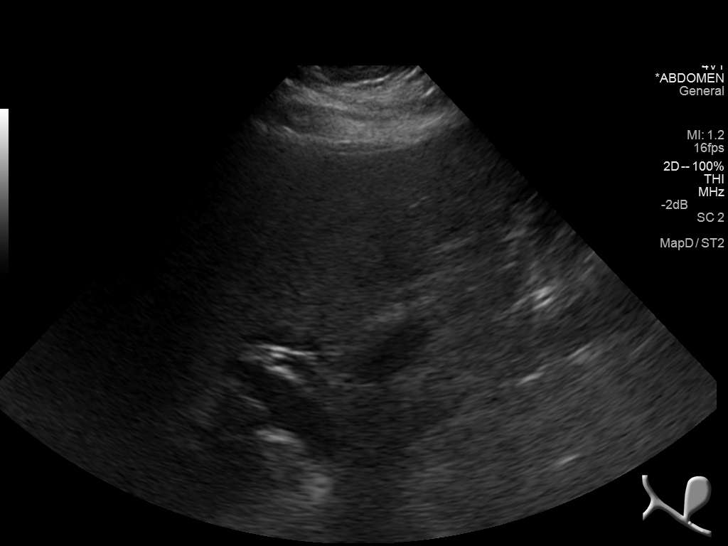
[im 18/34]
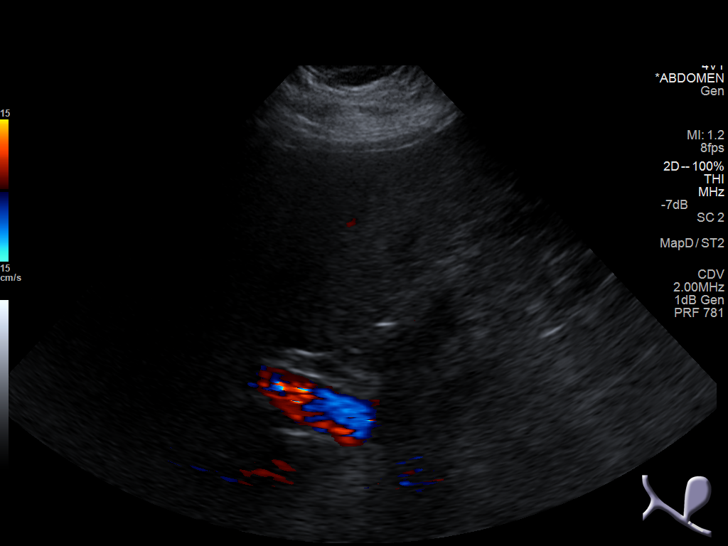
[im 21/34]
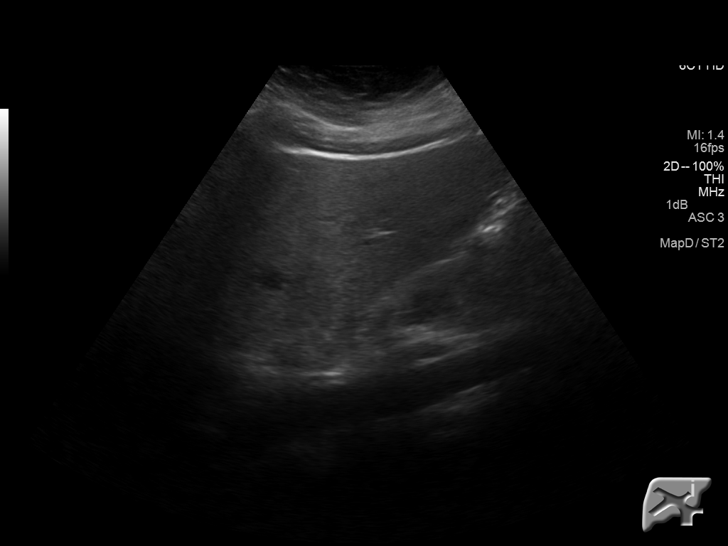
[im 23/34]
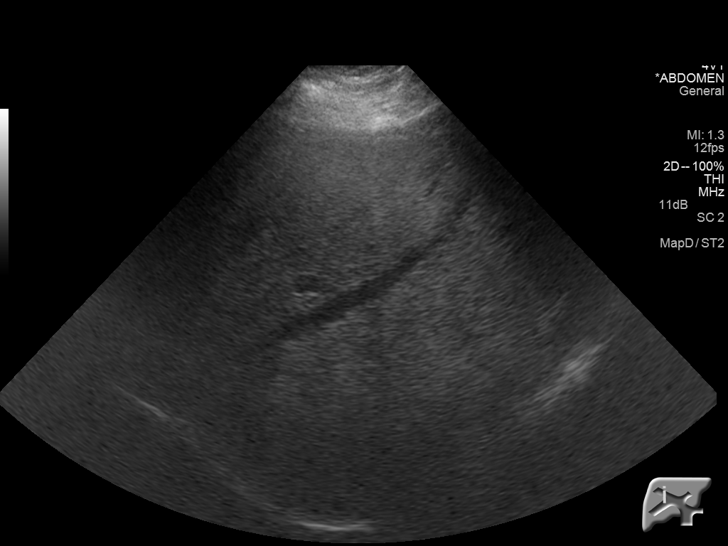
[im 25/34]
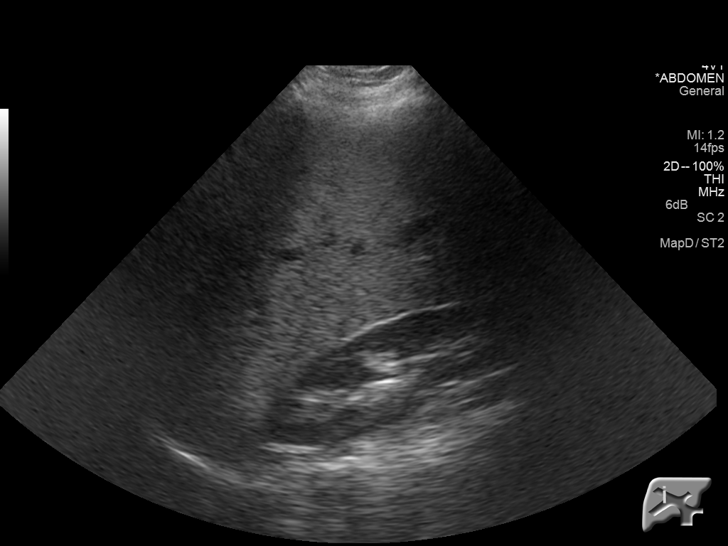
[im 28/34]
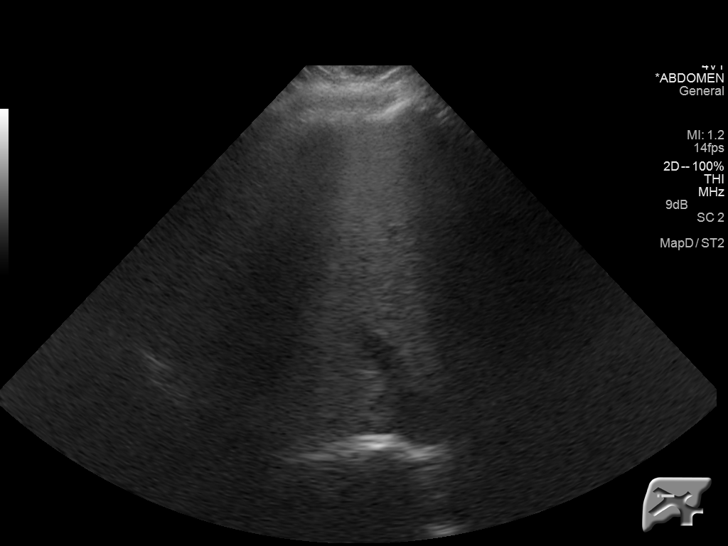
[im 31/34]
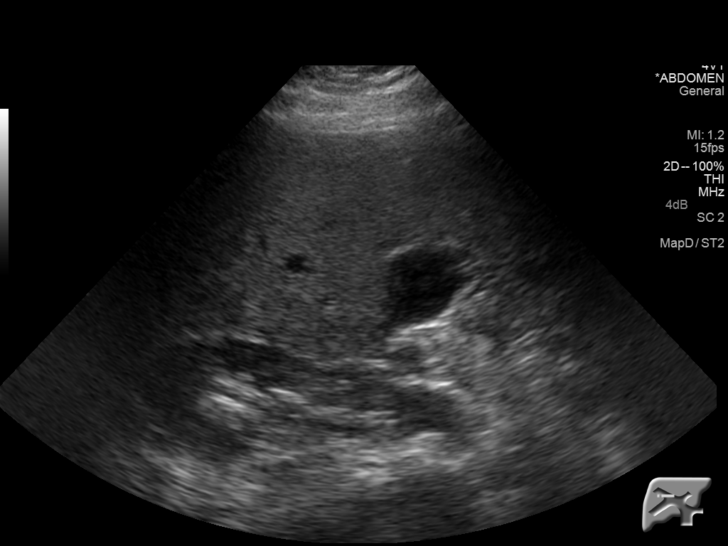
[im 34/34]
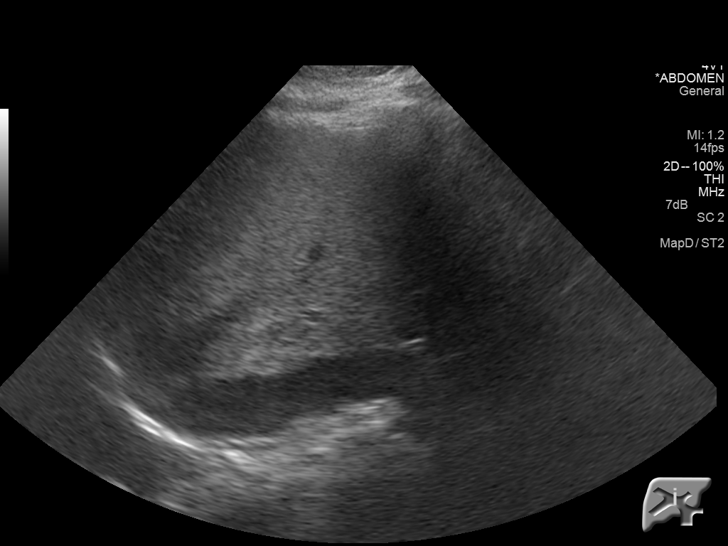

[14 of 25 positions shown; findings below may reference images not displayed]

FINDINGS: Gallbladder:

No gallstones or wall thickening visualized. No sonographic Murphy
sign noted.

Common bile duct:

Diameter: 5 mm

Liver:

Diffusely echogenic without intrahepatic biliary dilatation.
Hepatopetal portal vein.
IMPRESSION: Hepatic steatosis without acute RIGHT upper quadrant process.

## 2017-10-11 ENCOUNTER — Other Ambulatory Visit: Payer: Self-pay | Admitting: Student

## 2017-10-11 DIAGNOSIS — R1310 Dysphagia, unspecified: Secondary | ICD-10-CM

## 2017-10-15 ENCOUNTER — Ambulatory Visit
Admission: RE | Admit: 2017-10-15 | Discharge: 2017-10-15 | Disposition: A | Discharge status: Home / Self Care | Source: Ambulatory Visit | Attending: Student | Admitting: Student

## 2017-10-15 DIAGNOSIS — K219 Gastro-esophageal reflux disease without esophagitis: Secondary | ICD-10-CM | POA: Insufficient documentation

## 2017-10-15 DIAGNOSIS — R1310 Dysphagia, unspecified: Secondary | ICD-10-CM | POA: Diagnosis present

## 2017-10-15 DIAGNOSIS — K449 Diaphragmatic hernia without obstruction or gangrene: Secondary | ICD-10-CM | POA: Insufficient documentation

## 2018-11-29 ENCOUNTER — Other Ambulatory Visit: Payer: Self-pay

## 2018-11-29 ENCOUNTER — Emergency Department
Admission: EM | Admit: 2018-11-29 | Discharge: 2018-11-29 | Disposition: A | Discharge status: Home / Self Care | Attending: Emergency Medicine | Admitting: Emergency Medicine

## 2018-11-29 ENCOUNTER — Encounter: Payer: Self-pay | Admitting: *Deleted

## 2018-11-29 DIAGNOSIS — Z7983 Long term (current) use of bisphosphonates: Secondary | ICD-10-CM | POA: Insufficient documentation

## 2018-11-29 DIAGNOSIS — H9202 Otalgia, left ear: Secondary | ICD-10-CM | POA: Diagnosis present

## 2018-11-29 DIAGNOSIS — H6692 Otitis media, unspecified, left ear: Secondary | ICD-10-CM | POA: Insufficient documentation

## 2018-11-29 DIAGNOSIS — H6690 Otitis media, unspecified, unspecified ear: Secondary | ICD-10-CM

## 2018-11-29 DIAGNOSIS — J45909 Unspecified asthma, uncomplicated: Secondary | ICD-10-CM | POA: Insufficient documentation

## 2018-11-29 MED ORDER — OXYCODONE-ACETAMINOPHEN 5-325 MG PO TABS
1.0000 | ORAL_TABLET | Freq: Four times a day (QID) | ORAL | 0 refills | Status: AC | PRN
Start: 2018-11-29 — End: 2019-11-29

## 2018-11-29 MED ORDER — FLUTICASONE PROPIONATE 50 MCG/ACT NA SUSP: 2.0000 | Freq: Every day | NASAL | 0 refills | Status: AC

## 2018-11-29 MED ORDER — PREDNISONE 20 MG PO TABS
60.0000 mg | ORAL_TABLET | Freq: Once | ORAL | Status: AC
  Administered 2018-11-29: 60 mg via ORAL
  Filled 2018-11-29: qty 3

## 2018-11-29 MED ORDER — OXYCODONE-ACETAMINOPHEN 5-325 MG PO TABS
1.0000 | ORAL_TABLET | Freq: Once | ORAL | Status: AC
  Administered 2018-11-29: 1 via ORAL
  Filled 2018-11-29: qty 1

## 2018-11-29 MED ORDER — AZITHROMYCIN 500 MG PO TABS
500.0000 mg | ORAL_TABLET | Freq: Once | ORAL | Status: AC
  Administered 2018-11-29: 500 mg via ORAL
  Filled 2018-11-29: qty 1

## 2018-11-29 MED ORDER — DOXYCYCLINE HYCLATE 50 MG PO CAPS: 100.0000 mg | ORAL_CAPSULE | Freq: Two times a day (BID) | ORAL | 0 refills | Status: AC

## 2018-11-29 MED ORDER — DOXYCYCLINE HYCLATE 100 MG PO TABS
100.0000 mg | ORAL_TABLET | Freq: Once | ORAL | Status: AC
  Administered 2018-11-29: 100 mg via ORAL
  Filled 2018-11-29: qty 1

## 2018-11-29 MED ORDER — CIPROFLOXACIN-DEXAMETHASONE 0.3-0.1 % OT SUSP: 4.0000 [drp] | Freq: Two times a day (BID) | OTIC | 0 refills | Status: DC

## 2018-11-29 MED ORDER — AZITHROMYCIN 250 MG PO TABS: ORAL_TABLET | ORAL | 0 refills | Status: DC

## 2018-11-29 NOTE — ED Triage Notes (Signed)
Pt woke this morning with left ear pain. Took decongestant this am and still painful this afternoon. After use with a heating pad extreme pain. Hx of surgery to both ears as a child

## 2018-11-29 NOTE — Discharge Instructions (Addendum)
I have given you prescriptions for 2 oral antibiotics and antibiotic eardrops to cover for your infection.  Please call Dr. Rosario AdieViolet for an appointment after Christmas for ear recheck.

## 2018-11-29 NOTE — ED Provider Notes (Signed)
[facility] Emergency Department Provider Note  ____________________________________________  Time seen: Approximately 9:41 PM  I have reviewed the triage vital signs and the nursing notes.   HISTORY  Chief Complaint Otalgia    HPI  Kathryn Pearson is a 52 y.o. female that presents emergency department for evaluation of nasal congestion since this morning and left ear pain since this afternoon.  Patient states that she woke up this morning and was congested.  Her left ear was mildly sore.  She proceeded to use a heating pad on her left ear this afternoon because this is what her mother did when she was younger.  She states that about 30 minutes after using the heating pad, she began to be in excruciating pain.  No specific trauma to her ears.  She has had tubes to her left ear twice.  She has seen Dr. Andee PolesVaught for her ears before.  No fevers.   Past Medical History:  Diagnosis Date  . Asthma     There are no active problems to display for this patient.   Past Surgical History:  Procedure Laterality Date  . ABDOMINAL HYSTERECTOMY      Prior to Admission medications   Medication Sig Start Date End Date Taking? Authorizing Provider  azithromycin (ZITHROMAX Z-PAK) 250 MG tablet Take 2 tablets (500 mg) on  Day 1,  followed by 1 tablet (250 mg) once daily on Days 2 through 5. 11/29/18   Enid DerryWagner, Arilyn Brierley, PA-C  b complex vitamins tablet Take 1 tablet by mouth daily.    [provider]  Cholecalciferol (VITAMIN D3) 5000 UNITS TABS Take 1 tablet by mouth daily.    [provider]  ciprofloxacin-dexamethasone (CIPRODEX) OTIC suspension Place 4 drops into the left ear 2 (two) times daily. 11/29/18   Enid DerryWagner, Jaylee Lantry, PA-C  diphenhydrAMINE (BENADRYL) 25 MG tablet Take 25 mg by mouth every 6 (six) hours as needed (for allergic reaction).     [provider]  doxycycline (VIBRAMYCIN) 50 MG capsule Take 2 capsules (100 mg total) by mouth 2 (two)  times daily for 10 days. 11/29/18 12/09/18  Enid DerryWagner, Bianco Cange, PA-C  EPINEPHrine (EPIPEN) 0.3 mg/0.3 mL SOAJ injection Inject 0.3 mLs (0.3 mg total) into the muscle as needed. 07/14/13   Alvira MondaySchlossman, Erin, MD  estrogens, conjugated, (PREMARIN) 0.9 MG tablet Take 0.9 mg by mouth daily. Take daily for 21 days then do not take for 7 days.    [provider]  fish oil-omega-3 fatty acids 1000 MG capsule Take 1 g by mouth daily.     [provider]  fluticasone (FLONASE) 50 MCG/ACT nasal spray Place 2 sprays into both nostrils daily. 11/29/18 11/29/19  Enid DerryWagner, Kayloni Rocco, PA-C  HYDROcodone-acetaminophen (NORCO/VICODIN) 5-325 MG per tablet Take 1-2 tablets by mouth every 4 (four) hours as needed for moderate pain. 06/01/15   Loleta RoseForbach, Cory, MD  Linaclotide Karlene Einstein(LINZESS) 145 MCG CAPS capsule Take 145 mcg by mouth daily.    [provider]  lubiprostone (AMITIZA) 8 MCG capsule Take 8 mcg by mouth 2 (two) times daily with a meal.    [provider]  meloxicam (MOBIC) 7.5 MG tablet Take 7.5 mg by mouth 2 (two) times daily.    [provider]  ondansetron (ZOFRAN) 4 MG tablet Take 1-2 tabs by mouth every 8 hours as needed for nausea/vomiting 06/01/15   Loleta RoseForbach, Cory, MD  oxyCODONE-acetaminophen (PERCOCET) 5-325 MG tablet Take 1 tablet by mouth every 6 (six) hours as needed for severe pain. 11/29/18  11/29/19  Enid Derry, PA-C  pantoprazole (PROTONIX) 40 MG tablet Take 40 mg by mouth daily.    [provider]  traMADol (ULTRAM) 50 MG tablet Take 50 mg by mouth every 6 (six) hours as needed for pain.    [provider]  VITAMIN E PO Take 1 tablet by mouth daily.    [provider]    Allergies Baby powder [talc]; Eggs or egg-derived products; Penicillins; and Sulfa antibiotics  No family history on file.  Social History Social History   Tobacco Use  . Smoking status: Never Smoker  Substance Use Topics  . Alcohol use: No  . Drug use: Not on file      Review of Systems  Constitutional: No fever/chills ENT: Positive for nasal congestion Respiratory: No SOB. Gastrointestinal: No abdominal pain.  No nausea, no vomiting.  Musculoskeletal: Negative for musculoskeletal pain. Skin: Negative for rash, abrasions, lacerations, ecchymosis. Neurological: Negative for headaches   ____________________________________________   PHYSICAL EXAM:  VITAL SIGNS: ED Triage Vitals  Enc Vitals Group     BP 11/29/18 2101 (!) 139/59     Pulse Rate 11/29/18 2101 84     Resp 11/29/18 2101 20     Temp 11/29/18 2101 98.7 F (37.1 C)     Temp Source 11/29/18 2101 Oral     SpO2 11/29/18 2101 98 %     Weight 11/29/18 2104 245 lb (111.1 kg)     Height 11/29/18 2104 5\' 2"  (1.575 m)     Head Circumference --      Peak Flow --      Pain Score 11/29/18 2104 10     Pain Loc --      Pain Edu? --      Excl. in GC? --      Constitutional: Alert and oriented. Well appearing and in no acute distress. Eyes: Conjunctivae are normal. PERRL. EOMI. Head: Atraumatic. ENT:      Ears: Top of tympanic membrane erythematous. Blood in ear canal. No visible perforation.  No tenderness to palpation of pinna or tragus.      Nose: No congestion/rhinnorhea.      Mouth/Throat: Mucous membranes are moist.  Neck: No stridor.   Cardiovascular: Normal rate, regular rhythm.  Good peripheral circulation. Respiratory: Normal respiratory effort without tachypnea or retractions. Lungs CTAB. Good air entry to the bases with no decreased or absent breath sounds. Musculoskeletal: Full range of motion to all extremities. No gross deformities appreciated. Neurologic:  Normal speech and language. No gross focal neurologic deficits are appreciated.  Skin:  Skin is warm, dry and intact. No rash noted. Psychiatric: Mood and affect are normal. Speech and behavior are normal. Patient exhibits appropriate insight and judgement.   ____________________________________________    LABS (all labs ordered are listed, but only abnormal results are displayed)  Labs Reviewed - No data to display ____________________________________________  EKG   ____________________________________________  RADIOLOGY  No results found.  ____________________________________________    PROCEDURES  Procedure(s) performed:    Procedures    Medications  predniSONE (DELTASONE) tablet 60 mg (60 mg Oral Given 11/29/18 2155)  doxycycline (VIBRA-TABS) tablet 100 mg (100 mg Oral Given 11/29/18 2154)  oxyCODONE-acetaminophen (PERCOCET/ROXICET) 5-325 MG per tablet 1 tablet (1 tablet Oral Given 11/29/18 2154)  azithromycin (ZITHROMAX) tablet 500 mg (500 mg Oral Given 11/29/18 2238)     ____________________________________________   INITIAL IMPRESSION / ASSESSMENT AND PLAN / ED COURSE  Pertinent labs & imaging results that were available during my  care of the patient were reviewed by me and considered in my medical decision making (see chart for details).  Review of the [place] CSRS was performed in accordance of the [facility] prior to dispensing any controlled drugs.     Patient's diagnosis is consistent with otitis media with likely perforation.  Portion of visible tympanic membrane is erythematous.  No perforation is visualized.  Patient has an anaphylactic reaction to penicillins.  Patient was given a prescription for both but doxycycline and azithromycin to cover ear infection.  She was also given a prescription for Ciprodex eardrops. Patient is to follow up with ENT as directed. Patient is given ED precautions to return to the ED for any worsening or new symptoms.     ____________________________________________  FINAL CLINICAL IMPRESSION(S) / ED DIAGNOSES  Final diagnoses:  Acute otitis media, unspecified otitis media type      NEW MEDICATIONS STARTED DURING THIS VISIT:  ED Discharge Orders         Ordered    doxycycline (VIBRAMYCIN) 50 MG capsule  2 times daily      11/29/18 2222    ciprofloxacin-dexamethasone (CIPRODEX) OTIC suspension  2 times daily     11/29/18 2222    fluticasone (FLONASE) 50 MCG/ACT nasal spray  Daily     11/29/18 2222    azithromycin (ZITHROMAX Z-PAK) 250 MG tablet     11/29/18 2222    oxyCODONE-acetaminophen (PERCOCET) 5-325 MG tablet  Every 6 hours PRN     11/29/18 2224              This chart was dictated using voice recognition software/Dragon. Despite best efforts to proofread, errors can occur which can change the meaning. Any change was purely unintentional.    Enid DerryWagner, Braycen Burandt, PA-C 11/30/18 0004    Phineas SemenGoodman, Graydon, MD 11/30/18 [phone]

## 2019-10-20 ENCOUNTER — Other Ambulatory Visit: Payer: Self-pay

## 2019-10-20 DIAGNOSIS — Z20822 Contact with and (suspected) exposure to covid-19: Secondary | ICD-10-CM

## 2019-10-24 ENCOUNTER — Other Ambulatory Visit: Payer: Self-pay

## 2019-10-24 DIAGNOSIS — Z20822 Contact with and (suspected) exposure to covid-19: Secondary | ICD-10-CM

## 2019-10-25 LAB — NOVEL CORONAVIRUS, NAA: SARS-CoV-2, NAA: NOT DETECTED

## 2019-10-26 LAB — NOVEL CORONAVIRUS, NAA: SARS-CoV-2, NAA: NOT DETECTED

## 2019-11-24 ENCOUNTER — Ambulatory Visit: Attending: Internal Medicine

## 2019-11-24 ENCOUNTER — Other Ambulatory Visit: Payer: Self-pay

## 2019-11-24 DIAGNOSIS — Z20822 Contact with and (suspected) exposure to covid-19: Secondary | ICD-10-CM

## 2019-11-25 LAB — NOVEL CORONAVIRUS, NAA: SARS-CoV-2, NAA: DETECTED — AB

## 2020-06-05 ENCOUNTER — Other Ambulatory Visit: Payer: Self-pay | Admitting: Family Medicine

## 2020-06-05 DIAGNOSIS — Z1231 Encounter for screening mammogram for malignant neoplasm of breast: Secondary | ICD-10-CM

## 2020-06-13 ENCOUNTER — Ambulatory Visit
Admission: RE | Admit: 2020-06-13 | Discharge: 2020-06-13 | Disposition: A | Discharge status: Home / Self Care | Source: Ambulatory Visit | Attending: Family Medicine | Admitting: Family Medicine

## 2020-06-13 DIAGNOSIS — Z1231 Encounter for screening mammogram for malignant neoplasm of breast: Secondary | ICD-10-CM | POA: Diagnosis not present

## 2021-06-27 IMAGING — MG DIGITAL SCREENING BILAT W/ TOMO W/ CAD
8 series · 8 of 24 positions shown · non-contrast
Comparison: Previous exam(s).

CLINICAL DATA: Screening.

EXAM:
DIGITAL SCREENING BILATERAL MAMMOGRAM WITH TOMO AND CAD

[R CC synth-2D]
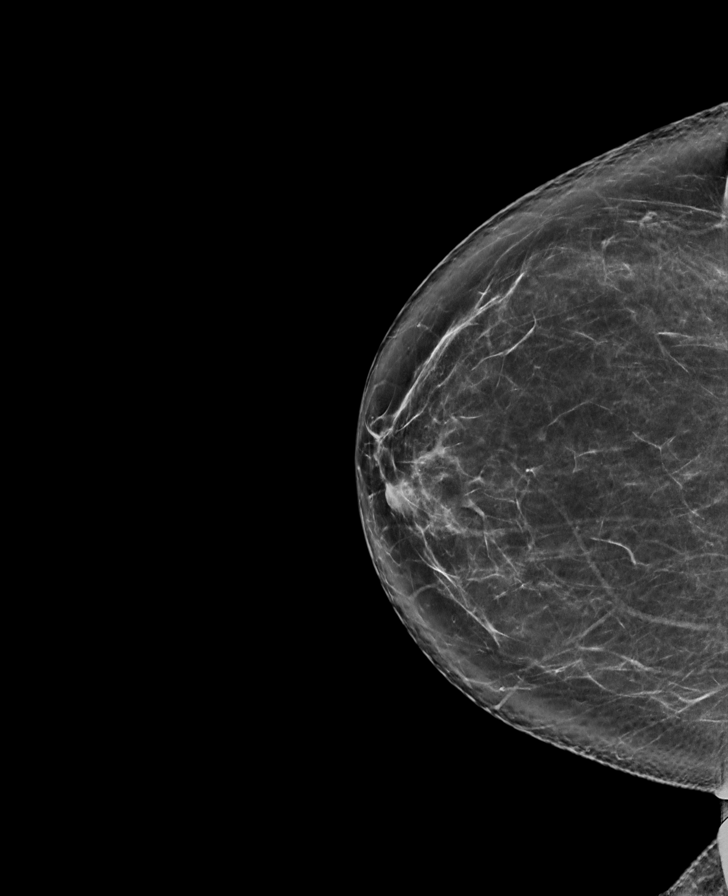

[L CC synth-2D]
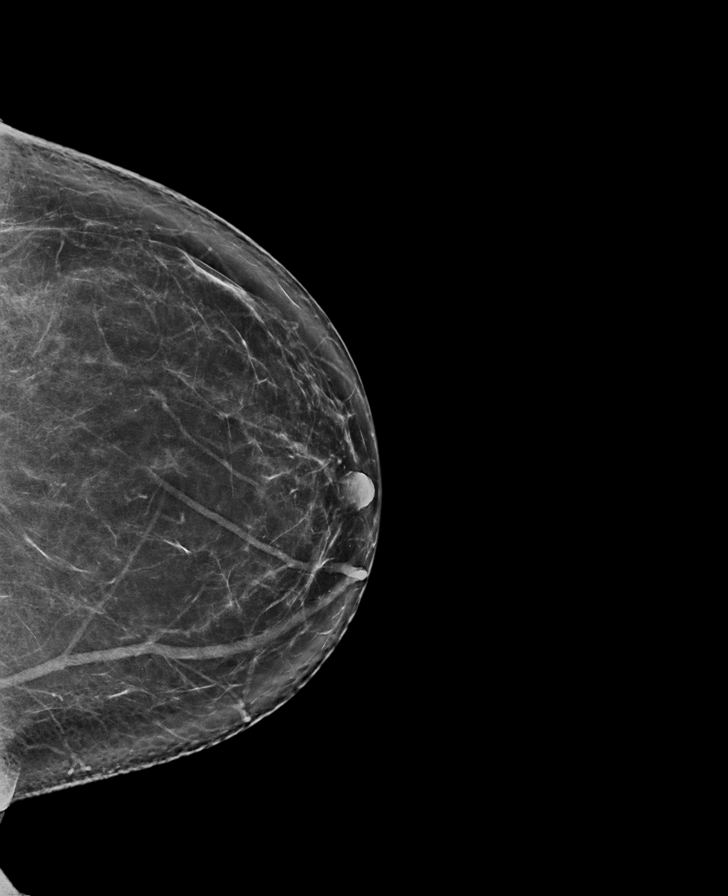

[L MLO synth-2D]
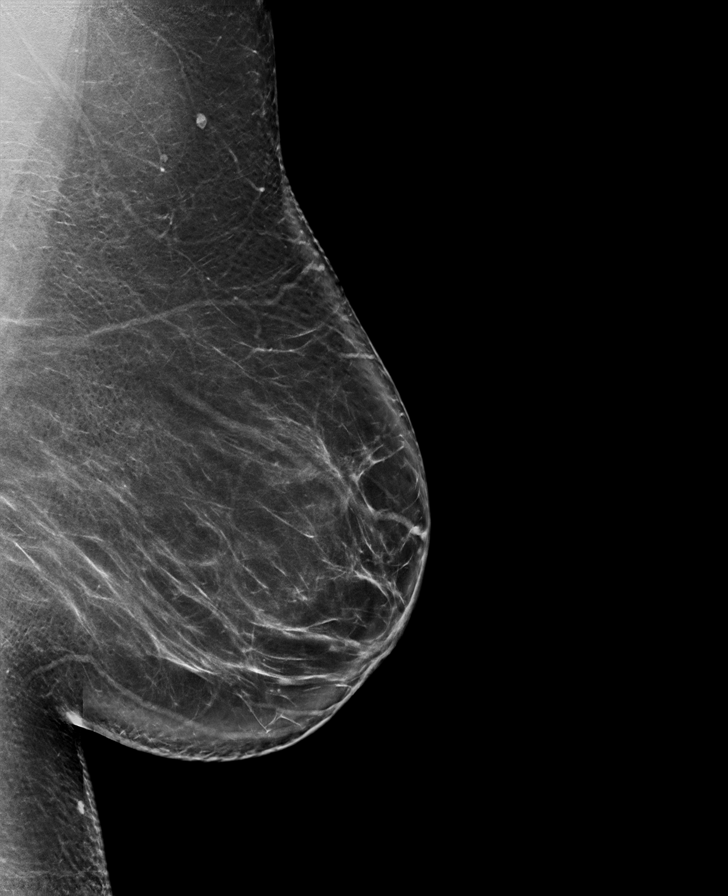

[R MLO synth-2D]
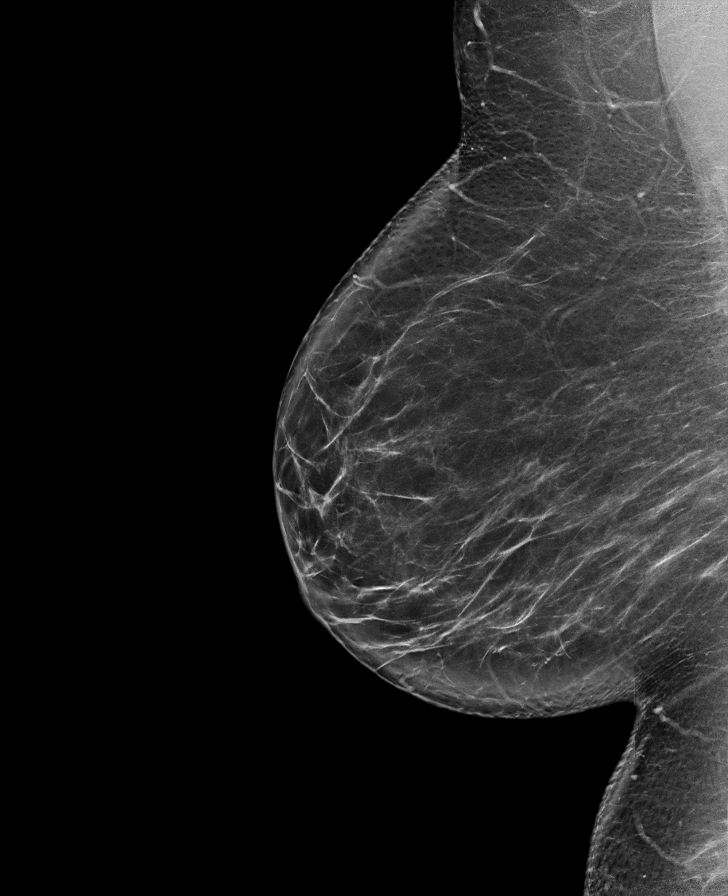

[R CC tomo · tomo slice 45/90.0]
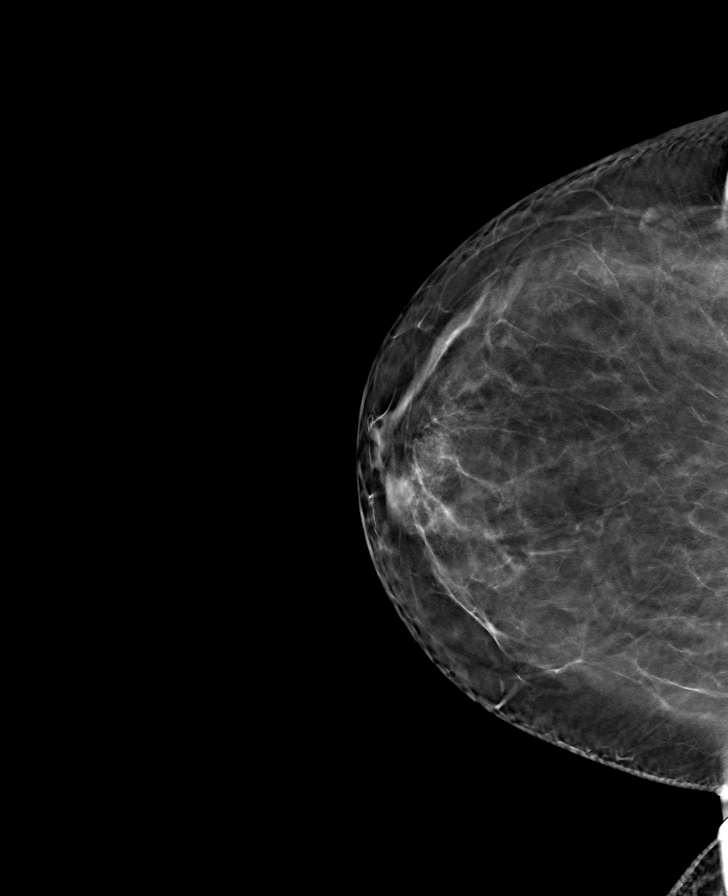

[R MLO tomo · tomo slice 49/96.0]
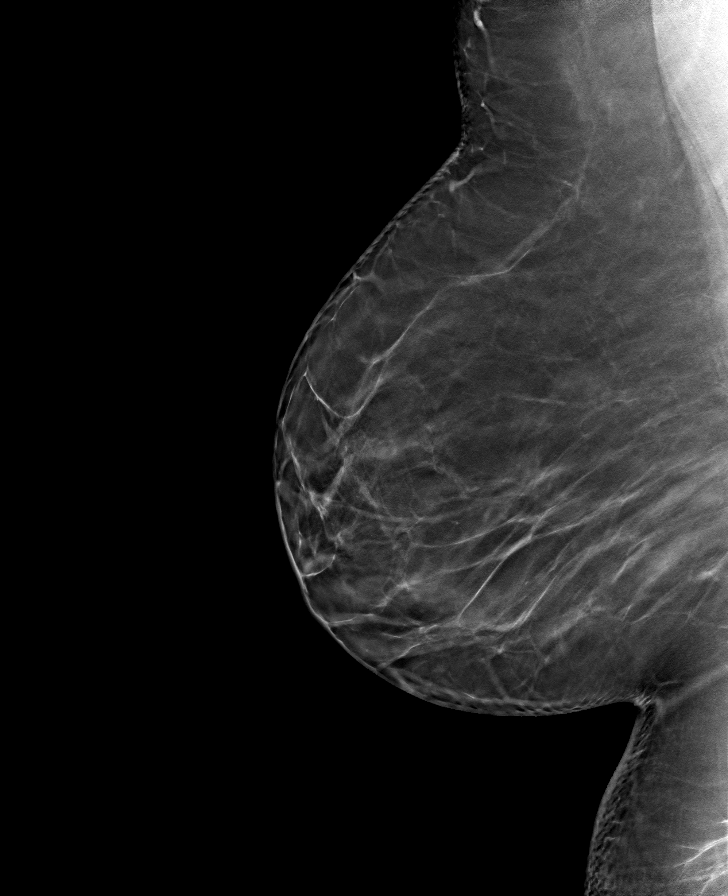

[L CC tomo · tomo slice 45/88.0]
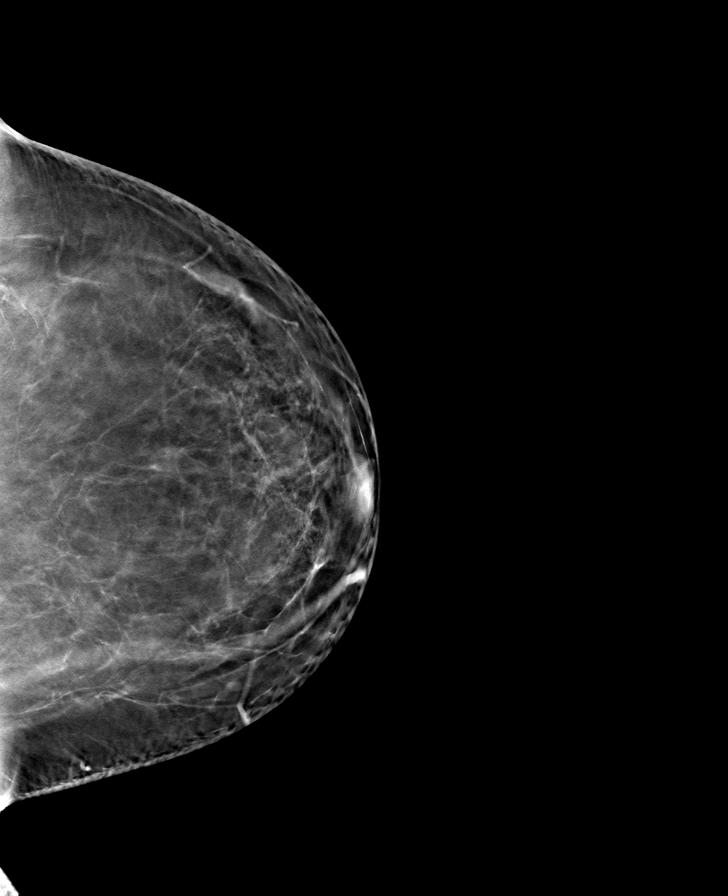

[L MLO tomo · tomo slice 52/103.0]
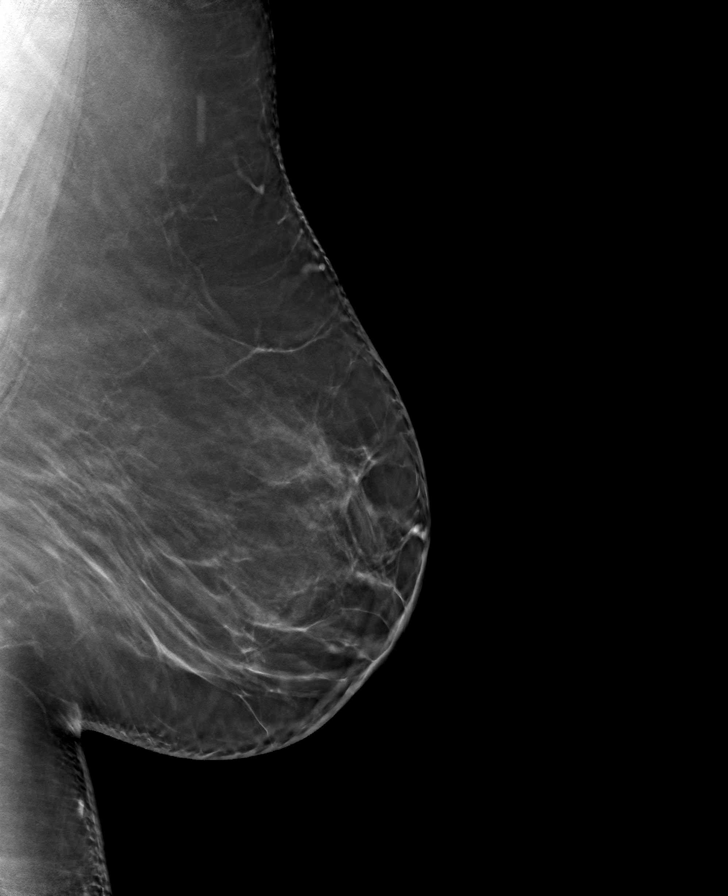

[8 of 24 positions shown; findings below may reference images not displayed]

ACR Breast Density Category b: There are scattered areas of
fibroglandular density.
FINDINGS: There are no findings suspicious for malignancy. Images were
processed with CAD.
IMPRESSION: No mammographic evidence of malignancy. A result letter of this
screening mammogram will be mailed directly to the patient.

RECOMMENDATION:
Screening mammogram in one year. (Code:[id number]-775)

BI-RADS CATEGORY  1: Negative.

## 2022-07-17 ENCOUNTER — Emergency Department

## 2022-07-17 ENCOUNTER — Other Ambulatory Visit: Payer: Self-pay

## 2022-07-17 ENCOUNTER — Emergency Department
Admission: EM | Admit: 2022-07-17 | Discharge: 2022-07-17 | Disposition: A | Discharge status: Home / Self Care | Attending: Emergency Medicine | Admitting: Emergency Medicine

## 2022-07-17 DIAGNOSIS — E119 Type 2 diabetes mellitus without complications: Secondary | ICD-10-CM | POA: Diagnosis not present

## 2022-07-17 DIAGNOSIS — R079 Chest pain, unspecified: Secondary | ICD-10-CM

## 2022-07-17 DIAGNOSIS — R42 Dizziness and giddiness: Secondary | ICD-10-CM | POA: Diagnosis not present

## 2022-07-17 DIAGNOSIS — J45909 Unspecified asthma, uncomplicated: Secondary | ICD-10-CM | POA: Diagnosis not present

## 2022-07-17 DIAGNOSIS — R531 Weakness: Secondary | ICD-10-CM | POA: Diagnosis not present

## 2022-07-17 DIAGNOSIS — R0789 Other chest pain: Secondary | ICD-10-CM | POA: Diagnosis present

## 2022-07-17 HISTORY — DX: Type 2 diabetes mellitus without complications: E11.9

## 2022-07-17 LAB — CBC
HCT: 38.2 % (ref 36.0–46.0)
Hemoglobin: 13 g/dL (ref 12.0–15.0)
MCH: 29.7 pg (ref 26.0–34.0)
MCHC: 34 g/dL (ref 30.0–36.0)
MCV: 87.4 fL (ref 80.0–100.0)
Platelets: 272 10*3/uL (ref 150–400)
RBC: 4.37 MIL/uL (ref 3.87–5.11)
RDW: 12.3 % (ref 11.5–15.5)
WBC: 9.1 10*3/uL (ref 4.0–10.5)
nRBC: 0 % (ref 0.0–0.2)

## 2022-07-17 LAB — TROPONIN I (HIGH SENSITIVITY)
Troponin I (High Sensitivity): 7 ng/L (ref <18)
Troponin I (High Sensitivity): 7 ng/L (ref <18)

## 2022-07-17 LAB — BASIC METABOLIC PANEL
Anion gap: 8 (ref 5–15)
BUN: 13 mg/dL (ref 6–20)
CO2: 26 mmol/L (ref 22–32)
Calcium: 9.3 mg/dL (ref 8.9–10.3)
Chloride: 107 mmol/L (ref 98–111)
Creatinine, Ser: 0.74 mg/dL (ref 0.44–1.00)
GFR, Estimated: >60 mL/min (ref >60)
Glucose, Bld: 120 mg/dL — ABNORMAL HIGH (ref 70–99)
Potassium: 3.7 mmol/L (ref 3.5–5.1)
Sodium: 141 mmol/L (ref 135–145)

## 2022-07-17 MED ORDER — ALUM & MAG HYDROXIDE-SIMETH 200-200-20 MG/5ML PO SUSP
30.0000 mL | Freq: Once | ORAL | Status: AC
  Administered 2022-07-17: 30 mL via ORAL
  Filled 2022-07-17: qty 30

## 2022-07-17 MED ORDER — LIDOCAINE VISCOUS HCL 2 % MT SOLN
15.0000 mL | Freq: Once | OROMUCOSAL | Status: AC
  Administered 2022-07-17: 15 mL via OROMUCOSAL
  Filled 2022-07-17: qty 15

## 2022-07-17 NOTE — ED Triage Notes (Signed)
Patient arrived by [facility] EMS from work. C/o central, sharp cp. Denies radiation. Co-workers reports syncopal episode prior to arrival. EMS reports no syncopal episodes while in their care. A&O x4 upon arrival  EMS administered 1 spray nitro and 324 aspirin  EMS vitals: 153/74 blood pressure 78HR 157 blood sugar

## 2022-07-17 NOTE — ED Provider Notes (Signed)
[facility] Emergency Department Provider Note     Event Date/Time   First MD Initiated Contact with Patient 07/17/22 1513     (approximate)   History   Chest Pain   HPI  Kathryn Pearson is a 56 y.o. female with a history of GERD, DM and asthma presents to the ED via EMS from her workplace.  Patient reports gradual onset of some central tightness and pressure in her chest, after she was at work.  She notes onset about 930 this morning.  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  She has been called standing up and feeling dizzy and weak, but was able to walk to her supervisors office.  At that point, supervisor witnessed the patient pass out.  Prior to her syncopal episode, the patient notes that her facility nurses recorded her blood pressure in the 200s over the 110s.  According to the patient, she was told that she was out for anywhere with 23 and 5 minutes, prior to EMS arrival.  EMS arrived on scene, and administered 1 spray of nitro and 324 mg of ASA.  Patient is unclear if she got the nitro spray, as she knows the EMS tach admitted to the medicine twice, once hitting her lips.  She presents to the ED with some intermittent central chest pressure.  She denies any nausea, vomiting, diaphoresis.   Physical Exam   Triage Vital Signs: ED Triage Vitals  Enc Vitals Group     BP 07/17/22 1257 131/62     Pulse Rate 07/17/22 1257 68     Resp 07/17/22 1257 20     Temp 07/17/22 1257 98.1 F (36.7 C)     Temp Source 07/17/22 1257 Oral     SpO2 07/17/22 1257 99 %     Weight 07/17/22 1255 238 lb (108 kg)     Height 07/17/22 1255 5\' 3"  (1.6 m)     Head Circumference --      Peak Flow --      Pain Score 07/17/22 1254 8     Pain Loc --      Pain Edu? --      Excl. in GC? --     Most recent vital signs: Vitals:   07/17/22 1530 07/17/22 1645  BP: 136/69 (!) 155/67  Pulse: 68 68  Resp:  18  Temp:    SpO2: 99% 99%    General Awake,  no distress. NAD HEENT NCAT. PERRL. EOMI. No rhinorrhea. Mucous membranes are moist.  CV:  Good peripheral perfusion. RRR RESP:  Normal effort. CTA ABD:  No distention. Soft, nontender   ED Results / Procedures / Treatments   Labs (all labs ordered are listed, but only abnormal results are displayed) Labs Reviewed  BASIC METABOLIC PANEL - Abnormal; Notable for the following components:      Result Value   Glucose, Bld 120 (*)    All other components within normal limits  CBC  TROPONIN I (HIGH SENSITIVITY)  TROPONIN I (HIGH SENSITIVITY)     EKG  Vent. rate 66 BPM PR interval 172 ms QRS duration 94 ms QT/QTcB 448/469 ms P-R-T axes 54 25 60 NSR No STEMI  RADIOLOGY  I personally viewed and evaluated these images as part of my medical decision making, as well as reviewing the written report by the radiologist.  ED Provider Interpretation: no acute findings}  DG Chest 2 View  Result Date: 07/17/2022  CLINICAL DATA:  Chest pain, dizziness EXAM: CHEST - 2 VIEW COMPARISON:  None Available. FINDINGS: Transverse diameter heart is slightly increased. Thoracic aorta is tortuous. There are no signs of pulmonary edema or focal pulmonary consolidation. There is no pleural effusion or pneumothorax. IMPRESSION: No active cardiopulmonary disease. Electronically Signed   By: Ernie Avena M.D.   On: 07/17/2022 13:15     PROCEDURES:  Critical Care performed: No  Procedures   MEDICATIONS ORDERED IN ED: Medications  alum & mag hydroxide-simeth (MAALOX/MYLANTA) 200-200-20 MG/5ML suspension 30 mL (has no administration in time range)  lidocaine (XYLOCAINE) 2 % viscous mouth solution 15 mL (has no administration in time range)     IMPRESSION / MDM / ASSESSMENT AND PLAN / ED COURSE  I reviewed the triage vital signs and the nursing notes.                              Differential diagnosis includes, but is not limited to, ACS, aortic dissection, pulmonary embolism, cardiac  tamponade, pneumothorax, pneumonia, pericarditis, myocarditis, GI-related causes including esophagitis/gastritis, and musculoskeletal chest wall pain.     Patient's presentation is most consistent with acute complicated illness / injury requiring diagnostic workup.  The patient is on the cardiac monitor to evaluate for evidence of arrhythmia and/or significant heart rate changes.  Patient to the ED for evaluation of central chest pain and discomfort with ongoing intermittent symptoms since this morning at 930.  She presents in no acute distress.  Overall work-up is reassuring as it shows no indication of ACS.  Patient troponin is negative x2.  Normal EKG without malignant arrhythmia or ST changes.  Labs are reassuring without signs of leukocytosis, critical anemia, or electrolyte abnormalities.  No chest x-ray evidence of any acute intrathoracic process, based on my review of images.  Patient with a heart score that is 3 and low risk overall.  No evidence or concern for PE based on patient's presentation and symptoms.  Patient was sat up in the bed during interim evaluation, and had several episodes of belching.  She verbalizes improvement of her symptoms overall, noting pain is about a 4 out of 10 at this time.  Patient's diagnosis is consistent with noncardiac chest pain, which may be GI related. Patient will be discharged home after GI cocktail. Patient is to follow up with cardiology and her PCP as needed or otherwise directed. Patient is given ED precautions to return to the ED for any worsening or new symptoms.     FINAL CLINICAL IMPRESSION(S) / ED DIAGNOSES   Final diagnoses:  Nonspecific chest pain     Rx / DC Orders   ED Discharge Orders     None        Note:  This document was prepared using Dragon voice recognition software and may include unintentional dictation errors.    Lissa Hoard, PA-C 07/17/22 1748    Merwyn Katos, MD 07/17/22 1800

## 2022-07-17 NOTE — ED Triage Notes (Signed)
Pt states she was at work when she started having central chest pain around 0930/10- pt then started having dizziness, numbness and tingling in both hands and feet- pt also having SHOB, denies nausea

## 2022-07-17 NOTE — Discharge Instructions (Addendum)
Your exam, labs, EKG, and chest x-ray overall reassuring.  No evidence of any acute heart attack based on your presentation.  Your cardiac enzymes are normal and other labs are within normal limits.  Your nonspecific chest pain may be due to to underlying GI causes.  Vital signs have been stable throughout her course in the ED.  You should follow-up with primary provider, cardiology, and/or return to the ED if needed.

## 2022-07-17 NOTE — ED Provider Triage Note (Signed)
Emergency Medicine Provider Triage Evaluation Note  Kathryn Pearson , a 56 y.o. female  was evaluated in triage.  Pt complains of right side chest pain with dizziness, tingling in hands and feet and shortness of breath. Symptoms started around 9:30am. No cardiac history.  Physical Exam  Ht 5\' 3"  (1.6 m)   Wt 108 kg   LMP 02/26/2015 (Approximate)   BMI 42.16 kg/m  Gen:   Awake, no distress   Resp:  Normal effort  MSK:   Moves extremities without difficulty  Other:    Medical Decision Making  Medically screening exam initiated at 12:55 PM.  Appropriate orders placed.  Kathryn Pearson was informed that the remainder of the evaluation will be completed by another provider, this initial triage assessment does not replace that evaluation, and the importance of remaining in the ED until their evaluation is complete.    Waldemar Dickens, FNP 07/17/22 1256
